# Patient Record
Sex: Female | Born: 1935 | Race: Black or African American | Hispanic: No | Marital: Married | Smoking: Never smoker
Health system: Southern US, Community
[De-identification: ages and names within clinical notes are randomized; demographics above are authoritative.]

## PROBLEM LIST (undated history)

## (undated) DIAGNOSIS — I509 Heart failure, unspecified: Secondary | ICD-10-CM

## (undated) DIAGNOSIS — I4891 Unspecified atrial fibrillation: Secondary | ICD-10-CM

## (undated) DIAGNOSIS — IMO0001 Reserved for inherently not codable concepts without codable children: Secondary | ICD-10-CM

## (undated) DIAGNOSIS — I10 Essential (primary) hypertension: Secondary | ICD-10-CM

## (undated) DIAGNOSIS — E119 Type 2 diabetes mellitus without complications: Secondary | ICD-10-CM

## (undated) DIAGNOSIS — E785 Hyperlipidemia, unspecified: Secondary | ICD-10-CM

## (undated) HISTORY — PX: CORONARY ARTERY BYPASS GRAFT: SHX141

## (undated) HISTORY — PX: ABDOMINAL HYSTERECTOMY: SHX81

---

## 2004-01-24 ENCOUNTER — Other Ambulatory Visit: Payer: Self-pay

## 2007-01-24 ENCOUNTER — Inpatient Hospital Stay: Payer: Self-pay | Admitting: Internal Medicine

## 2007-01-24 ENCOUNTER — Other Ambulatory Visit: Payer: Self-pay

## 2007-01-30 ENCOUNTER — Other Ambulatory Visit: Payer: Self-pay

## 2007-02-02 ENCOUNTER — Other Ambulatory Visit: Payer: Self-pay

## 2007-02-10 ENCOUNTER — Encounter: Payer: Self-pay | Admitting: Internal Medicine

## 2007-02-11 ENCOUNTER — Encounter: Payer: Self-pay | Admitting: Internal Medicine

## 2007-02-28 ENCOUNTER — Ambulatory Visit: Payer: Self-pay | Admitting: Internal Medicine

## 2007-04-08 ENCOUNTER — Other Ambulatory Visit: Payer: Self-pay

## 2007-04-08 ENCOUNTER — Inpatient Hospital Stay: Payer: Self-pay | Admitting: Internal Medicine

## 2007-05-13 ENCOUNTER — Other Ambulatory Visit: Payer: Self-pay

## 2007-05-13 ENCOUNTER — Inpatient Hospital Stay: Payer: Self-pay | Admitting: Internal Medicine

## 2008-09-28 IMAGING — CT CT CHEST W/ CM
1 series · 15 of 33 positions shown, 19 images · non-contrast
Comparison: none

REASON FOR EXAM: hypoxemia
COMMENTS:

[Series 7: soft tissue · axial · 0.74mm/px · z∈[+374,+632]mm · 15 of 102 slices shown, 19 images]
[im 8/102  mediastinal]
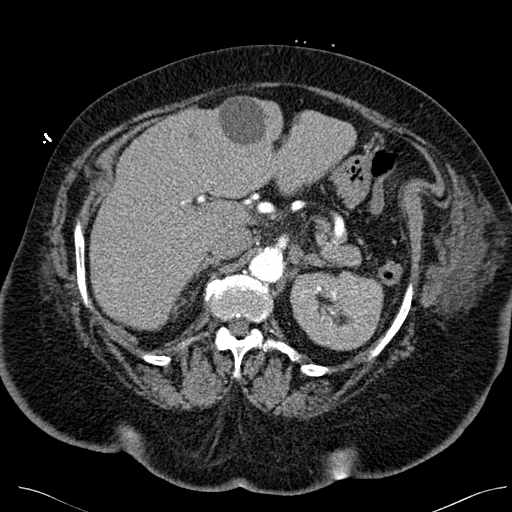
[im 8/102  lung]
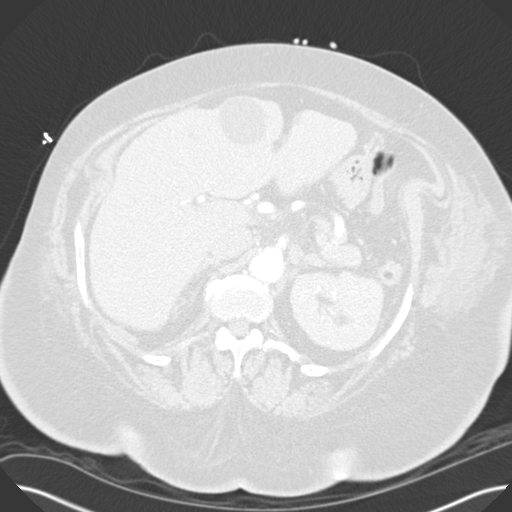
[im 15/102  lung]
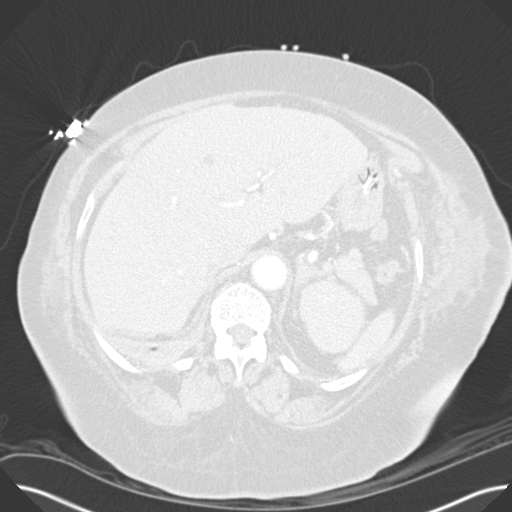
[im 21/102  lung]
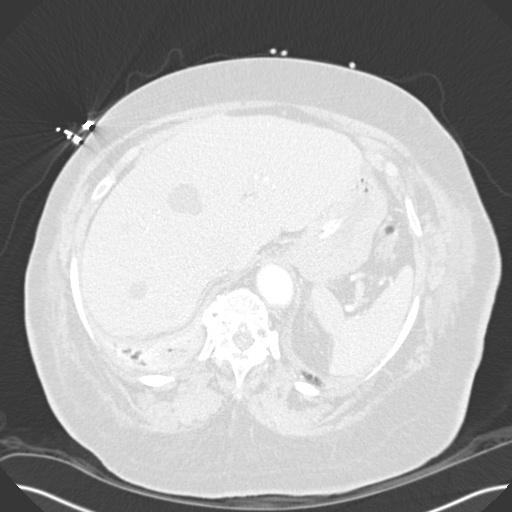
[im 27/102  lung]
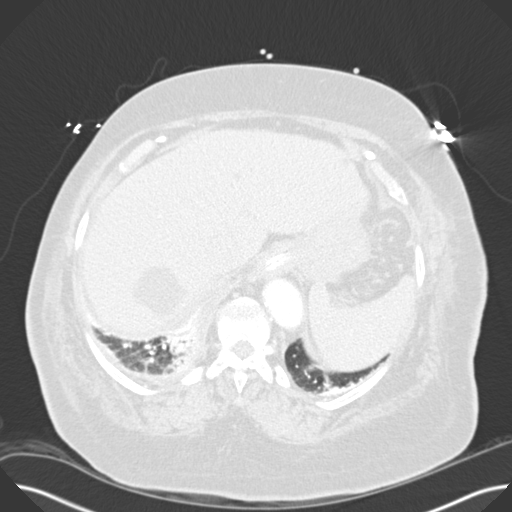
[im 34/102  mediastinal]
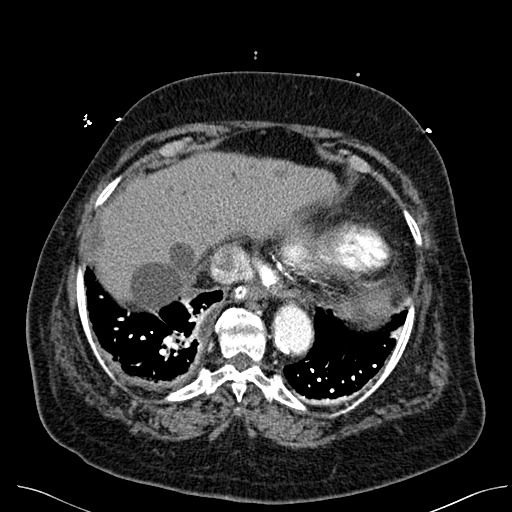
[im 34/102  lung]
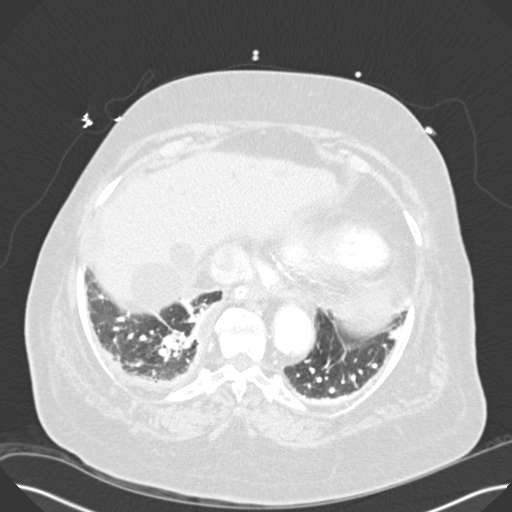
[im 41/102  lung]
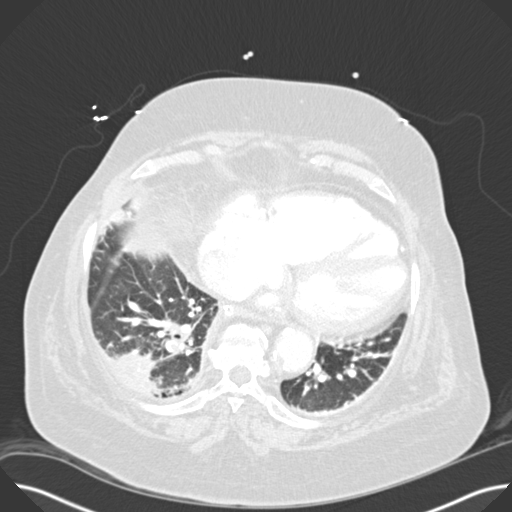
[im 45/102  lung]
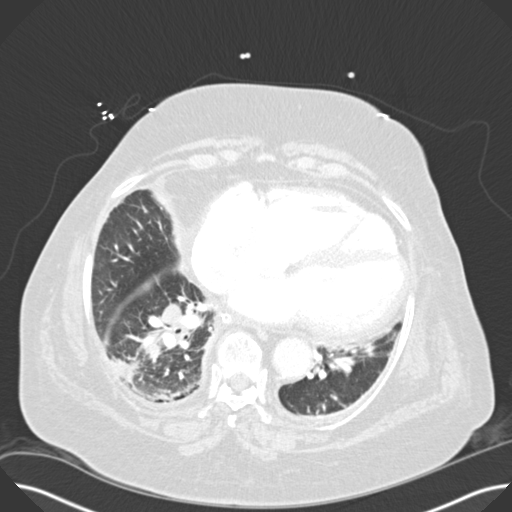
[im 53/102  lung]
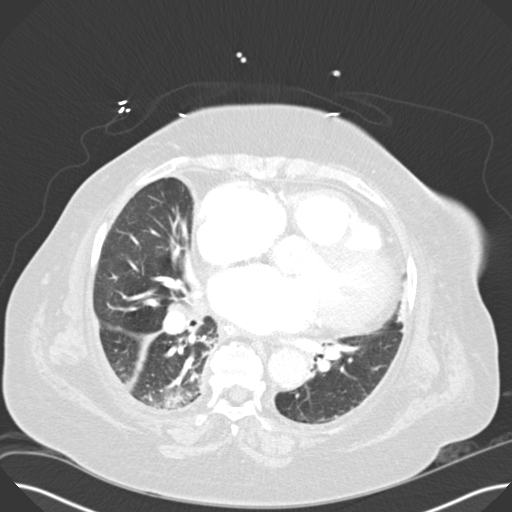
[im 57/102  mediastinal]
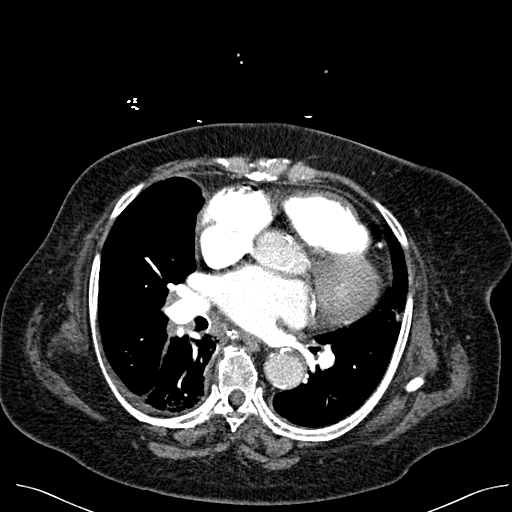
[im 57/102  lung]
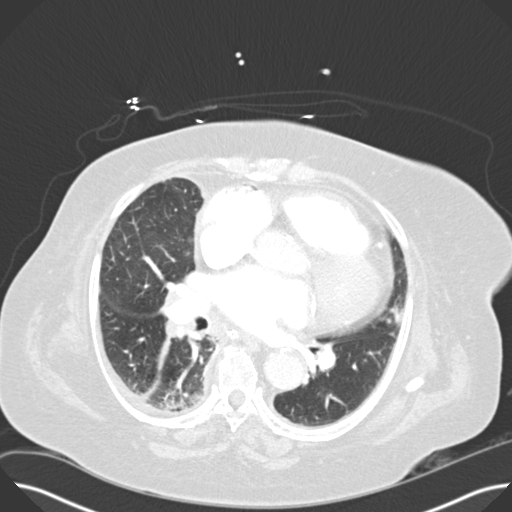
[im 61/102  lung]
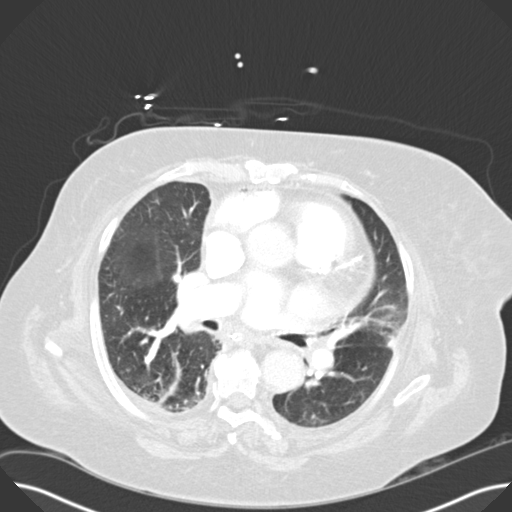
[im 68/102  lung]
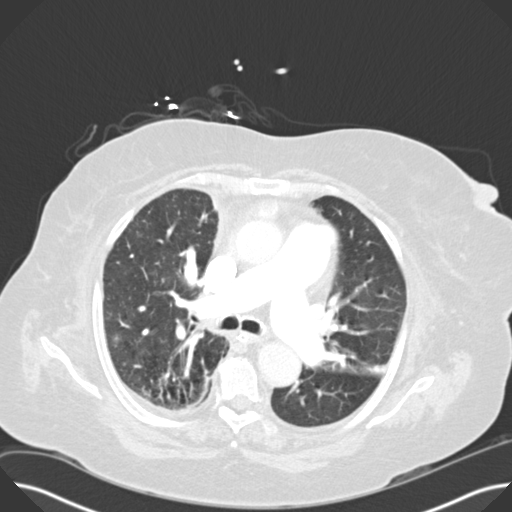
[im 75/102  lung]
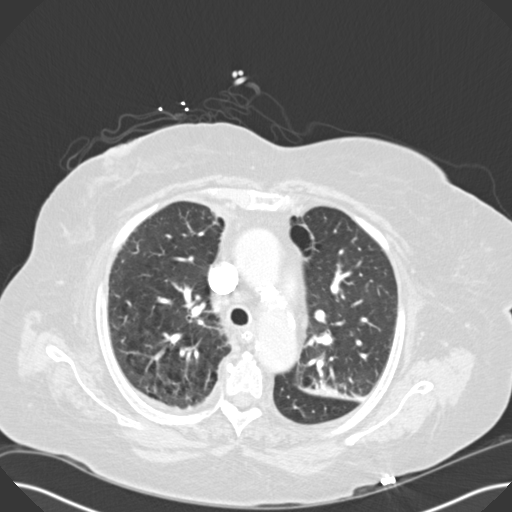
[im 81/102  mediastinal]
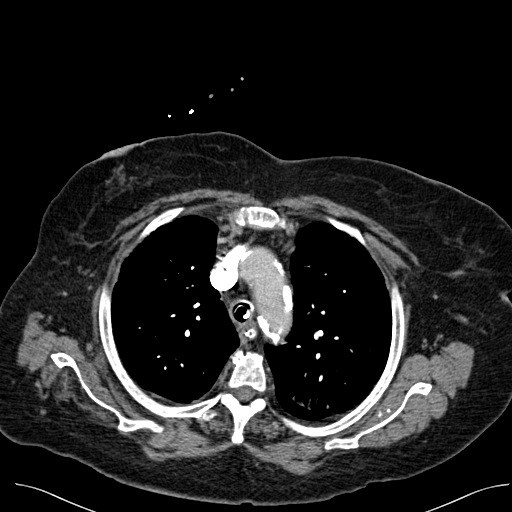
[im 81/102  lung]
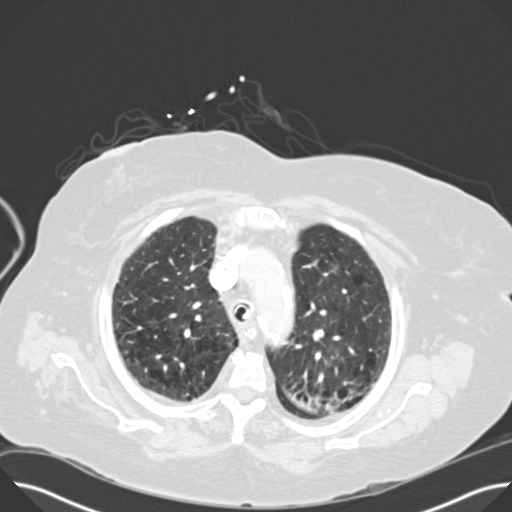
[im 87/102  lung]
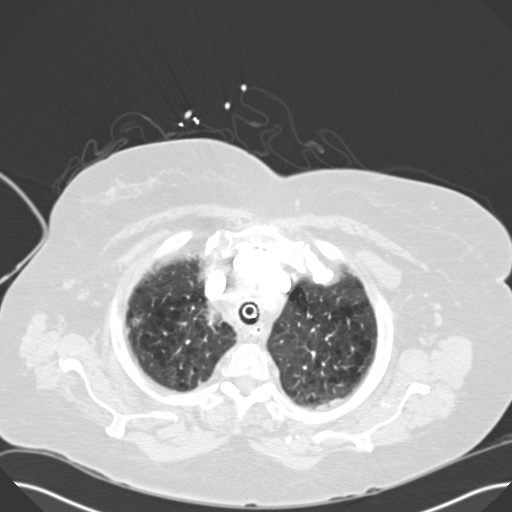
[im 94/102  lung]
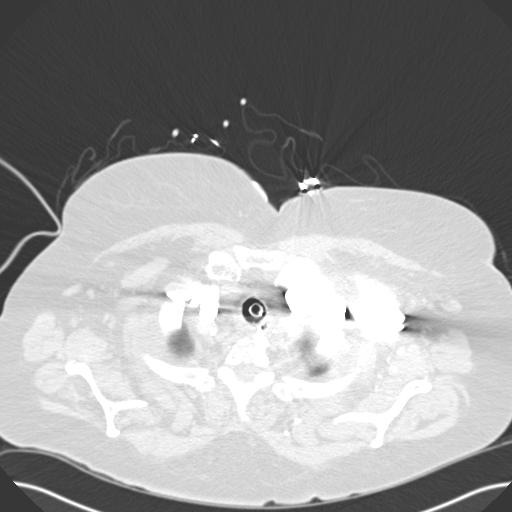

[15 of 33 positions shown; findings below may reference images not displayed]

PROCEDURE:     CT  - CT CHEST (FOR PE) W  - January 30, 2007  [DATE]

RESULT:     Helical 3 mm sections were obtained from the thoracic inlet
through the lung bases status post intravenous administration of 100 ml of
Isovue 370.

Evaluation of the mediastinum and hilar regions and structures demonstrates
multichamber cardiac enlargement.  There is evidence of adenopathy within
the RIGHT lower hilar region.  Evaluation of the pulmonary arterial system
demonstrates filling defects within segmental and subsegmental pulmonary
arteries in the RIGHT lower lobe region.  No further filling defects are
appreciated. Evaluation of the lung parenchyma demonstrates patchy areas of
nodular density within the RIGHT lung particularly within the peripheral
base.  This may be represent a region of lung infarction though etiology
such as atelectasis, infiltrate or possibly even a mass cannot be excluded.
There is also thickening of the interstitial markings throughout both lungs.
The visualized upper abdominal viscera demonstrate multiple low attenuating
areas scattered throughout the liver and statistically likely representing
multiple cysts. The remaining visualized upper abdominal viscera otherwise
are grossly unremarkable.
IMPRESSION: CV:1)Pulmonary embolic disease within segmental and subsegmental pulmonary
arteries in the RIGHT lower lobe.

2)Interstitial changes within the RIGHT and LEFT lungs.

3)Atelectasis versus infiltrate versus a more ominous etiology along the
peripheral base of the RIGHT lower lobe. These findings were relayed to the
patient's floor at the time of the initial interpretation.

## 2008-09-30 IMAGING — US US PELV - US TRANSVAGINAL
1 series · 17 of 17 positions shown · non-contrast
Comparison: none

REASON FOR EXAM: Vaginal bleed in patient with pulmonary embolism for PE
COMMENTS:

[Series 1: us pelv - us transvaginal · 17 of 17 slices shown]
[im 1/17]
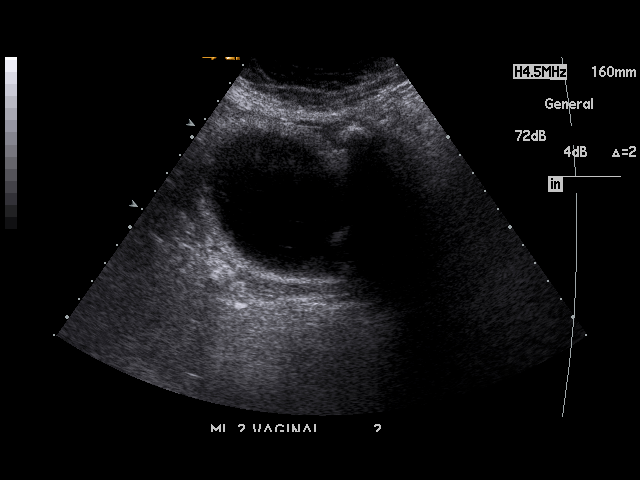
[im 2/17]
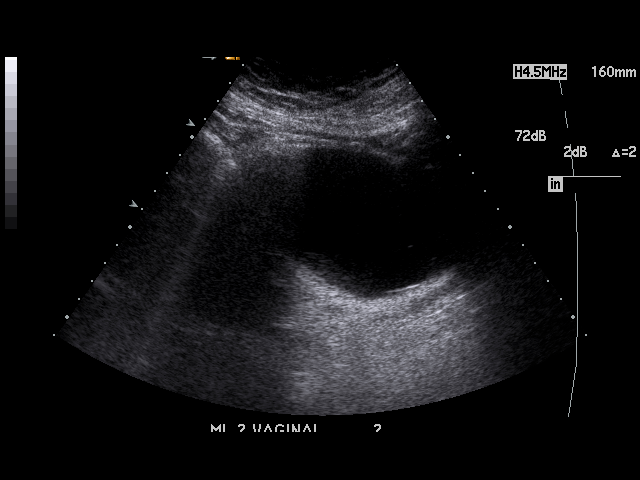
[im 3/17]
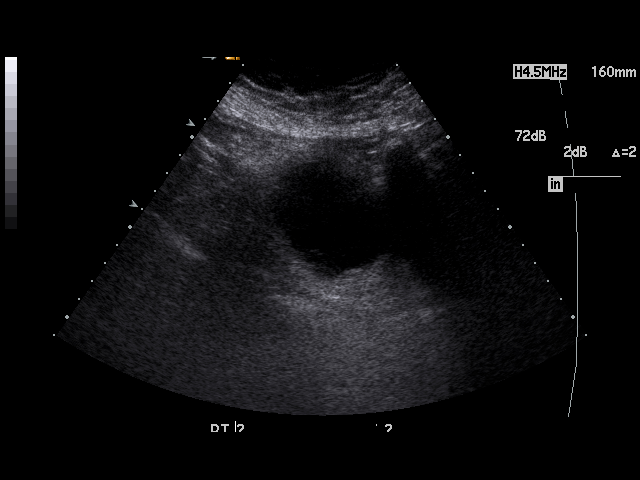
[im 4/17]
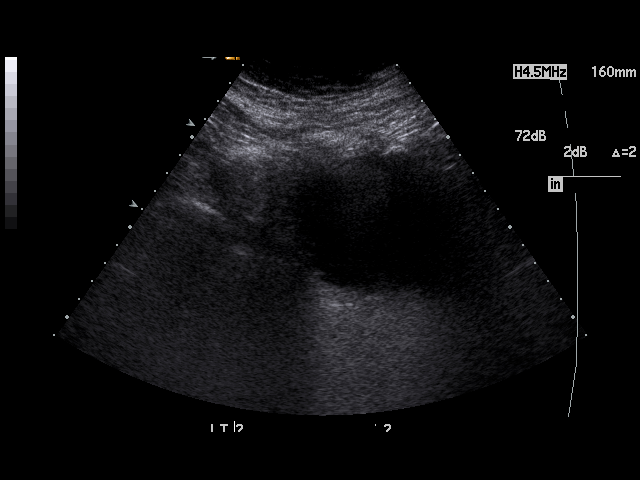
[im 5/17]
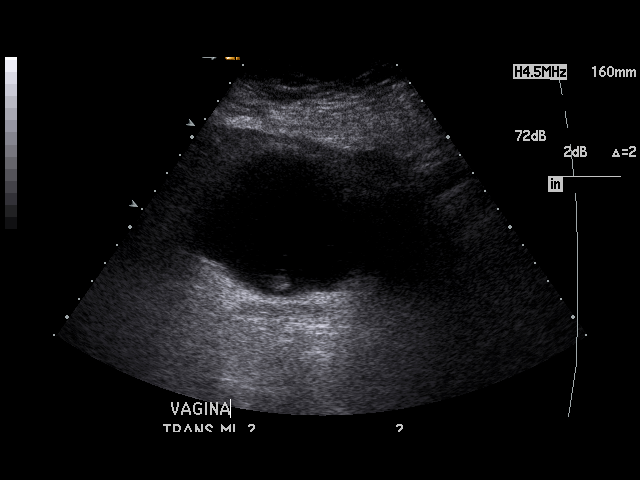
[im 6/17]
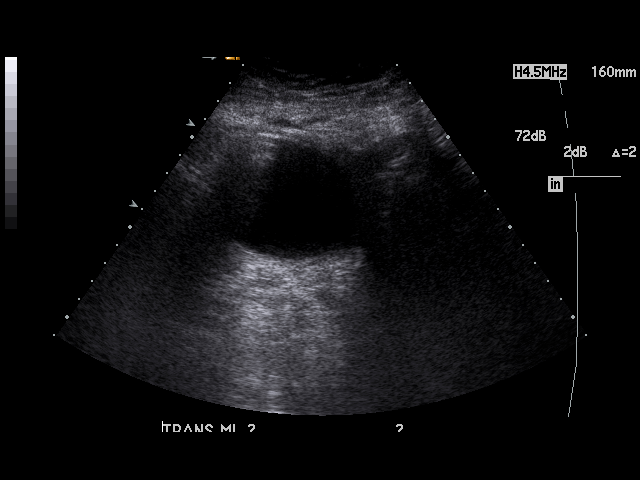
[im 7/17]
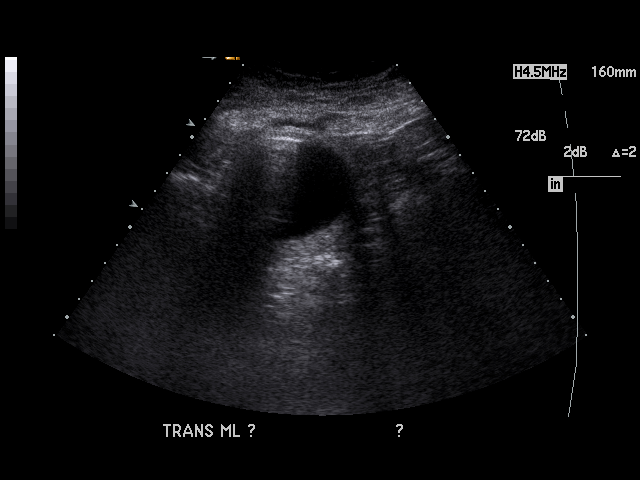
[im 8/17]
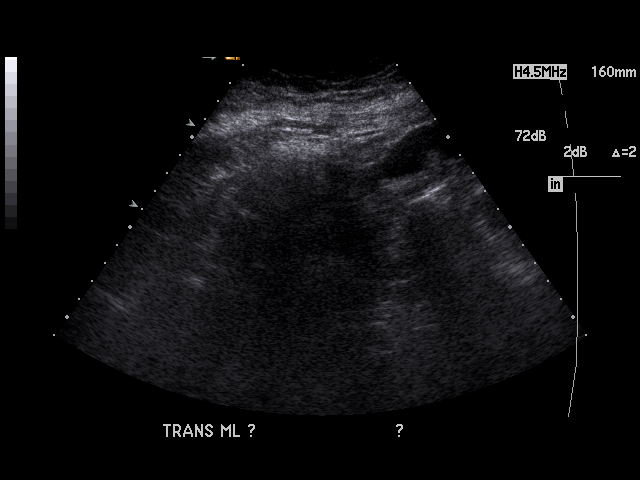
[im 9/17]
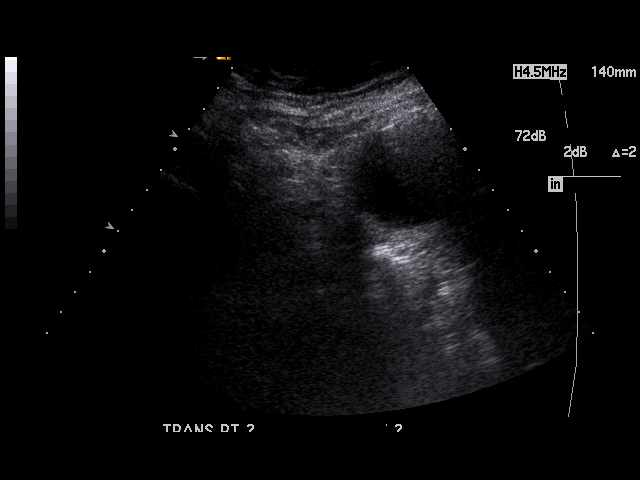
[im 10/17]
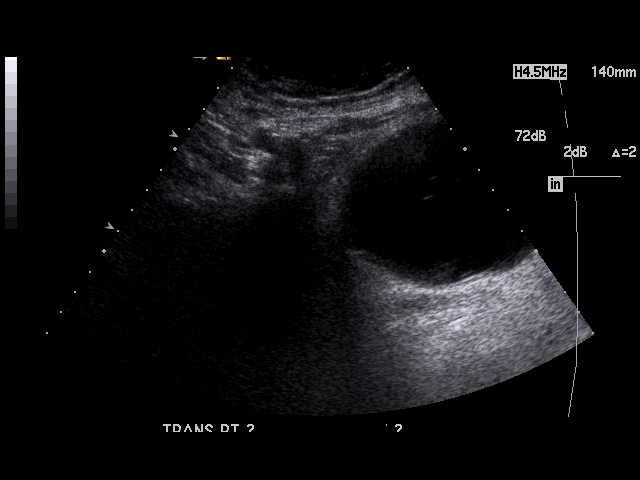
[im 11/17]
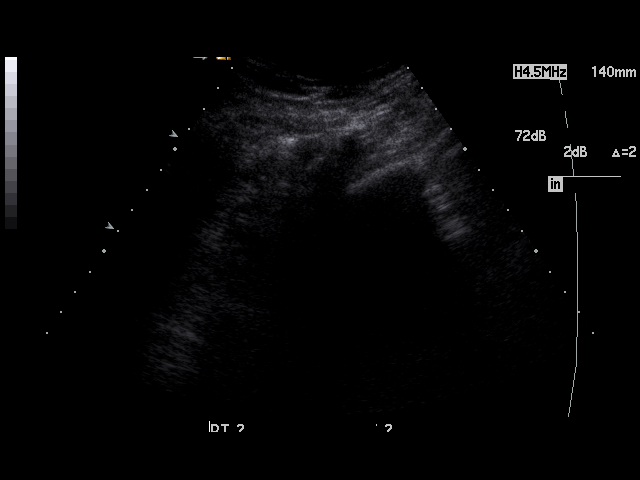
[im 12/17]
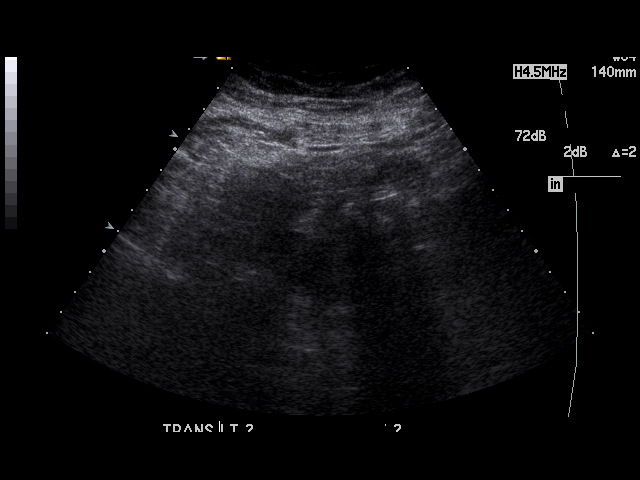
[im 13/17]
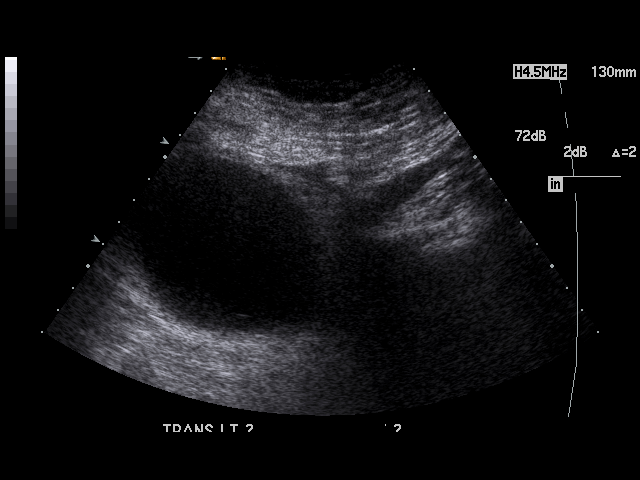
[im 14/17]
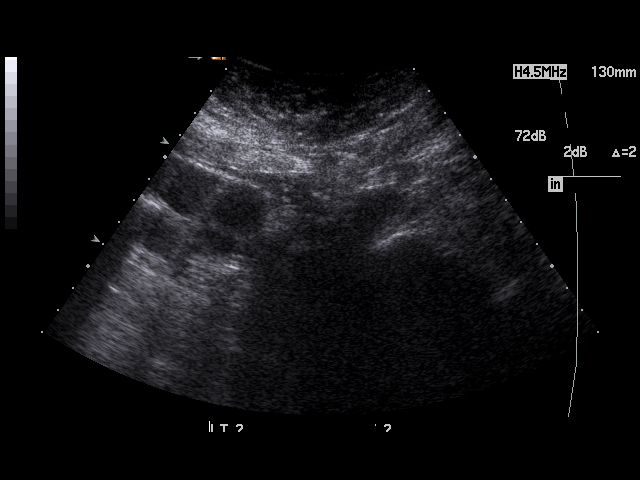
[im 15/17]
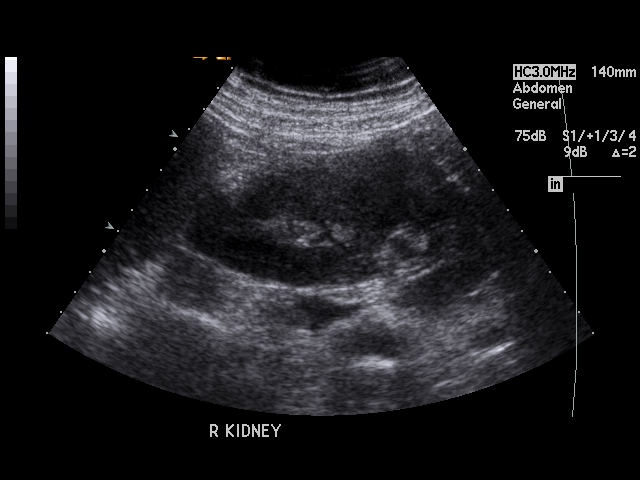
[im 16/17]
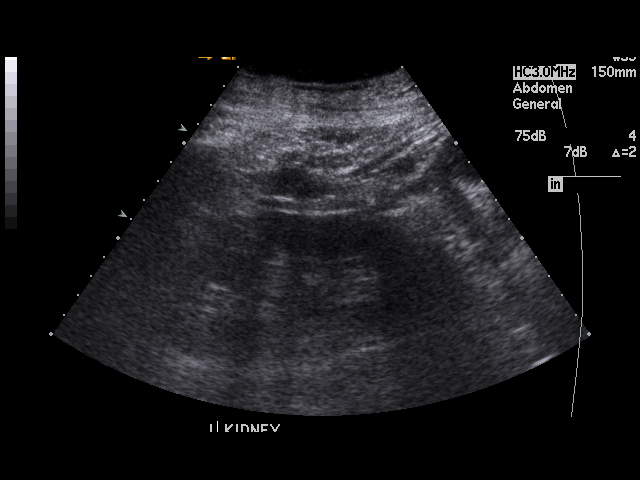
[im 17/17]
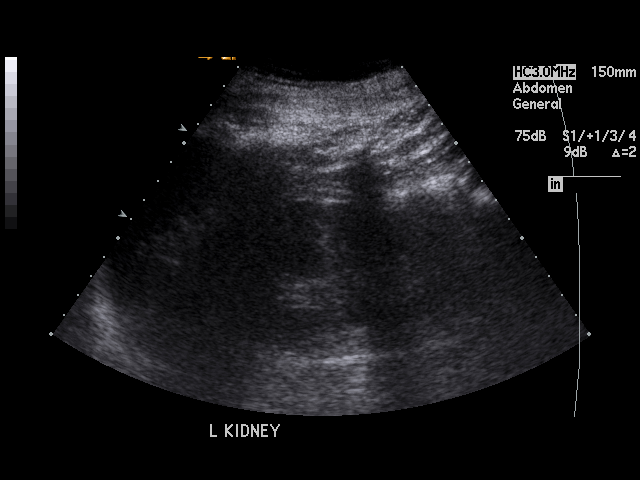

[17 of 17 positions shown; findings below may reference images not displayed]

PROCEDURE:     US  - US PELVIS MASS EXAM  - [DATE] [DATE] [DATE] [DATE]

RESULT:     Transabdominal images were obtained. The patient is status post
hysterectomy. Evaluation of the pelvis demonstrates no evidence of pelvic
masses, free fluid, or drainable loculated fluid collections.  The ovaries
are not identified.  The urinary bladder is partially distended.  Limited
evaluation of the RIGHT and LEFT kidneys demonstrates no gross
abnormalities.
IMPRESSION: Unremarkable pelvic ultrasound in a patient with the
history of a prior hysterectomy. There is no evidence of drainable loculated
fluid collections or masses.

## 2008-10-02 IMAGING — US US CAROTID DUPLEX BILAT
1 series · 17 of 24 positions shown · non-contrast
Comparison: none

REASON FOR EXAM: subacute infarct
COMMENTS:

[Series 1: us carotid duplex bilat · 17 of 53 slices shown]
[im 1/53]
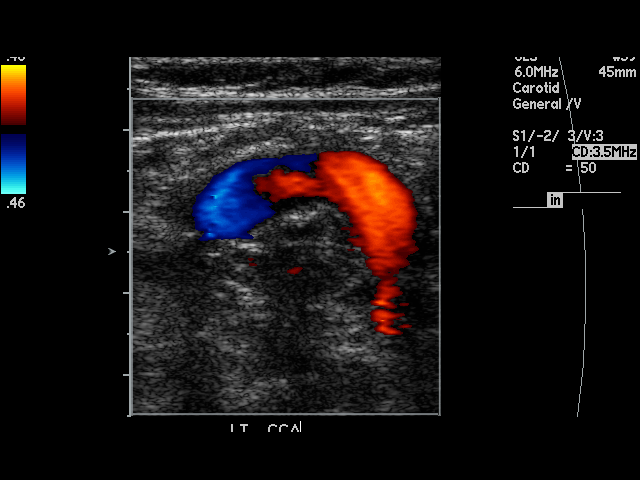
[im 5/53]
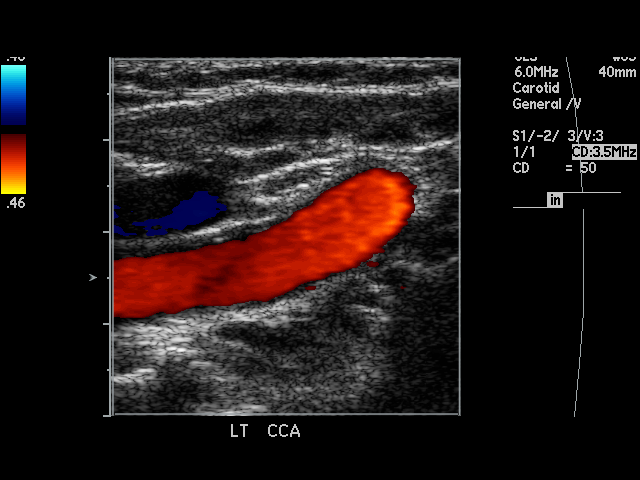
[im 7/53]
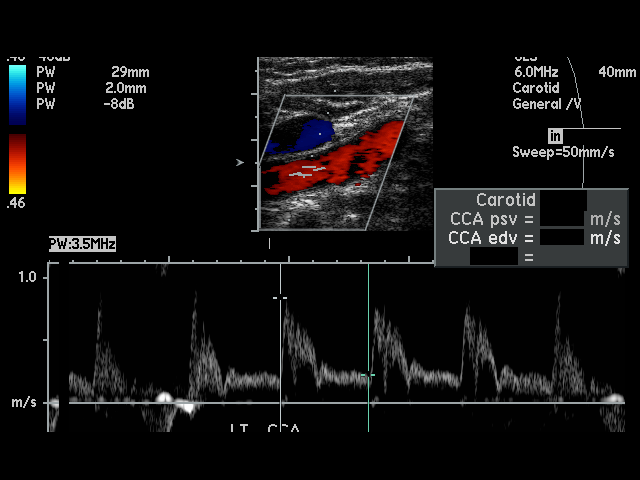
[im 10/53]
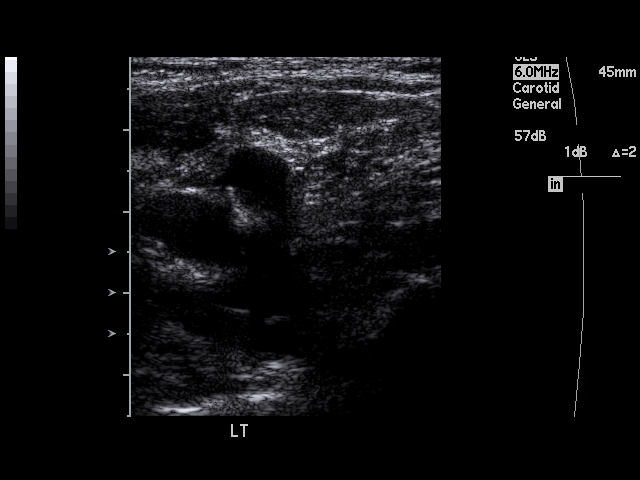
[im 14/53]
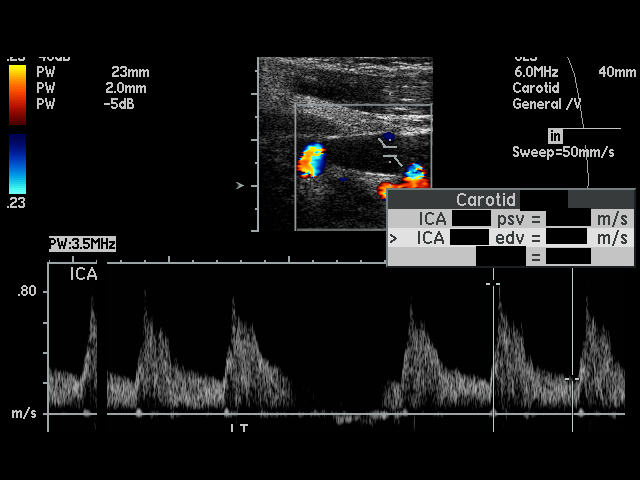
[im 16/53]
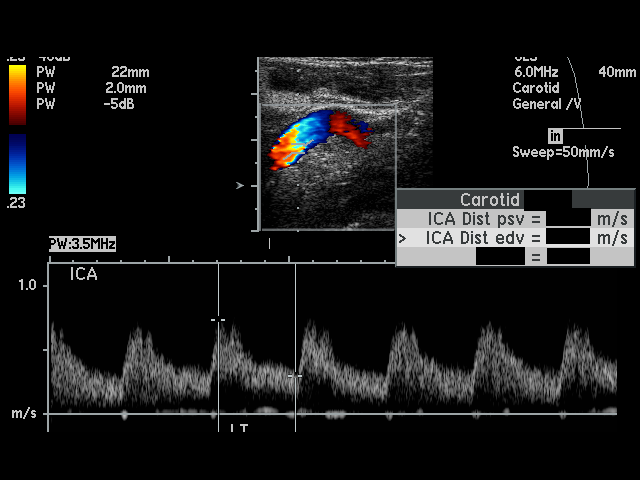
[im 21/53]
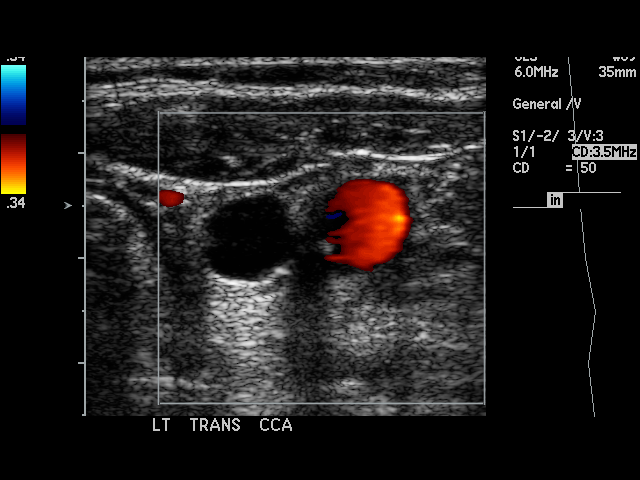
[im 23/53]
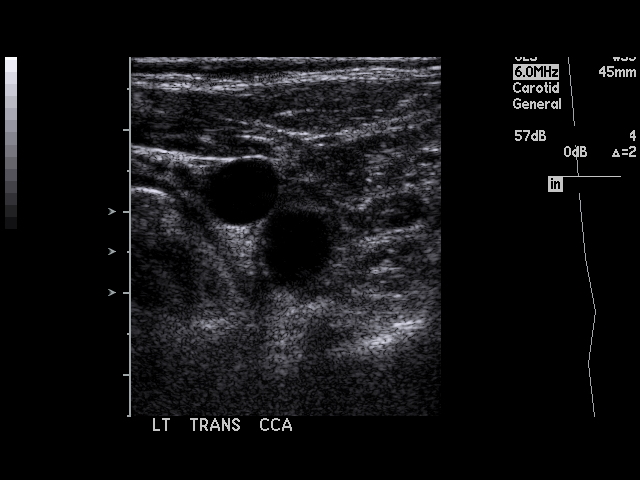
[im 28/53]
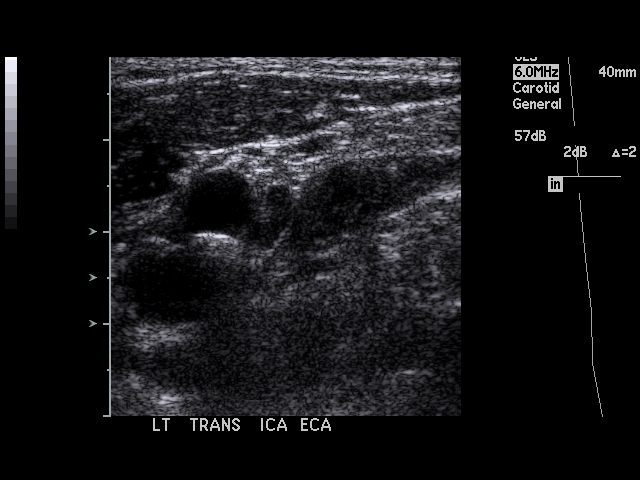
[im 30/53]
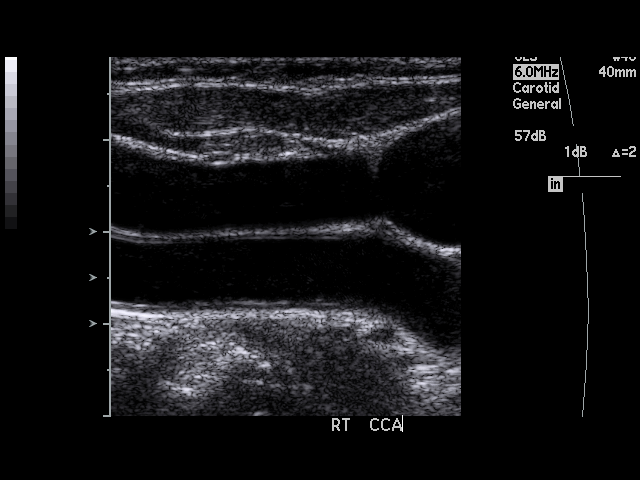
[im 32/53]
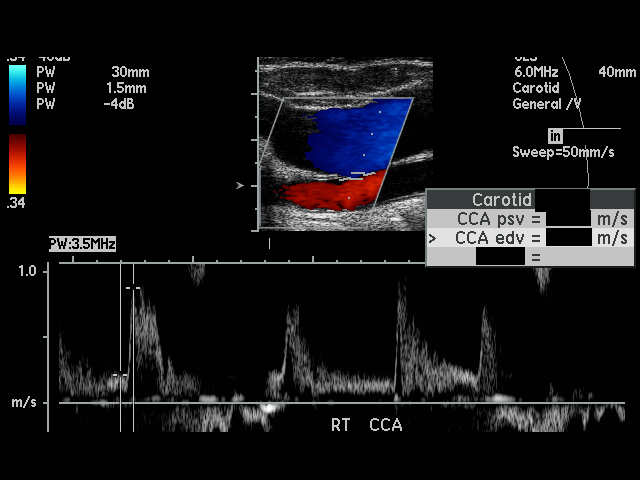
[im 37/53]
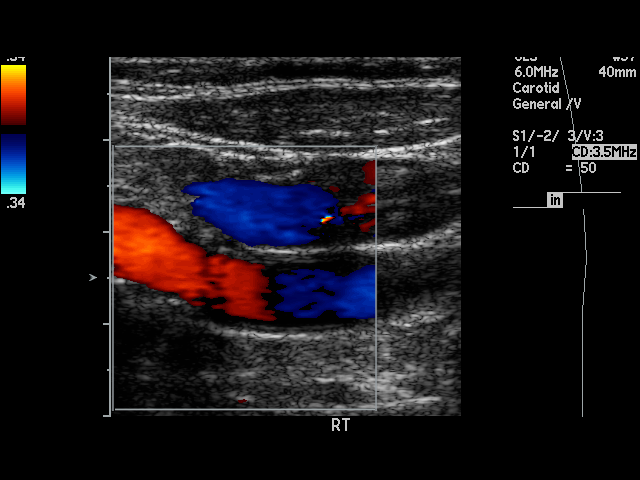
[im 39/53]
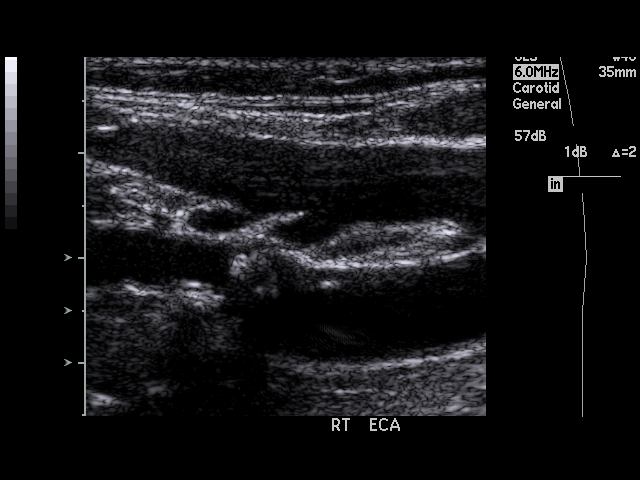
[im 43/53]
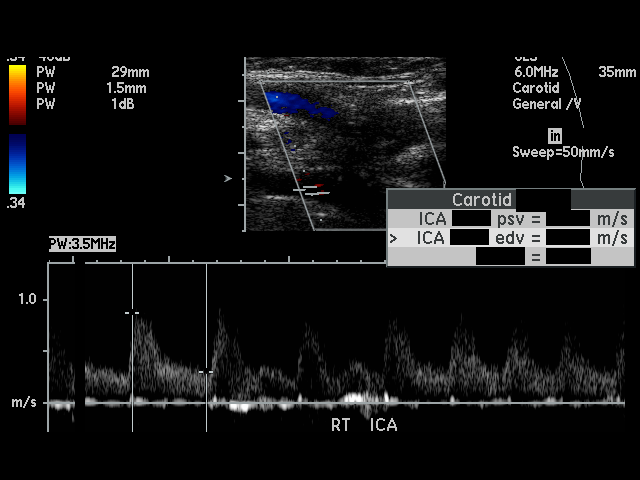
[im 46/53]
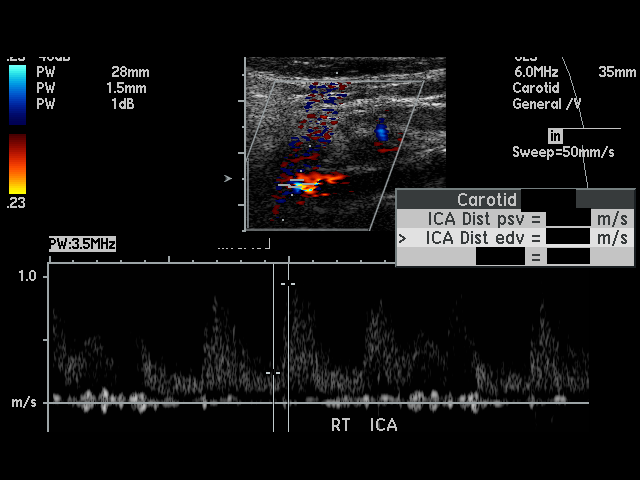
[im 48/53]
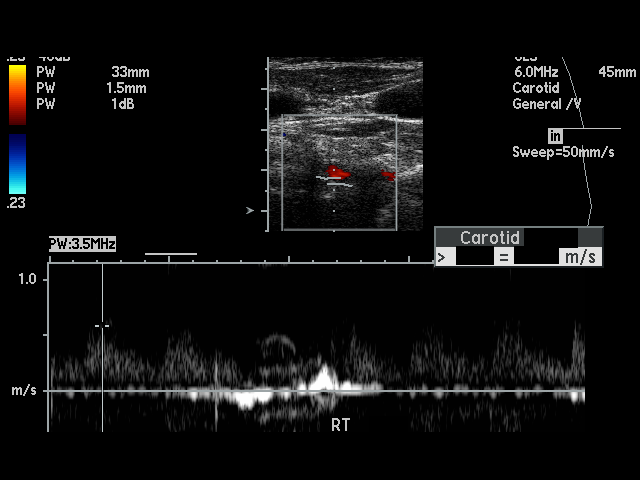
[im 53/53]
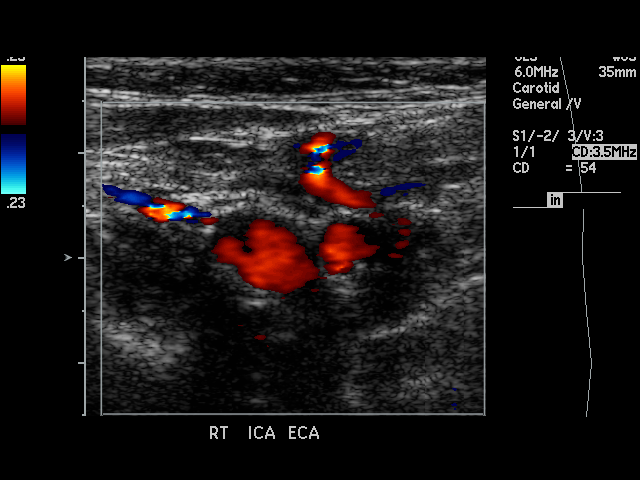

[17 of 24 positions shown; findings below may reference images not displayed]

PROCEDURE:     US  - US CAROTID DOPPLER BILATERAL  - February 03, 2007 [DATE]

RESULT:     There is slight calcific plaque formation about the carotids
bifurcations bilaterally. On the RIGHT, the peak RIGHT common carotid artery
flow velocity measures .955 meters per second and the peak RIGHT internal
carotid artery flow velocity measures 1.320 meters per second. The IC/CC
ratio is 1.382. On the LEFT, the peak LEFT common carotid artery flow
velocity measures .903 meters per second and the peak LEFT internal carotid
artery flow velocity measures .941 meters per second. The IC/CC ratio is
1.042.  These values bilaterally are in the normal range and compatible with
the absence of hemodynamically significant stenosis.

Antegrade flow is noted in both vertebrals.
IMPRESSION: 1. There is noted slight calcific plaque formation about the carotid
bifurcations bilaterally.
2. No hemodynamically significant stenosis is seen on either side.
3. Antegrade flow is noted in both vertebrals.

## 2015-09-01 ENCOUNTER — Emergency Department: Payer: Medicare Other

## 2015-09-01 ENCOUNTER — Inpatient Hospital Stay: Admit: 2015-09-01 | Payer: Medicare Other

## 2015-09-01 ENCOUNTER — Inpatient Hospital Stay
Admission: EM | Admit: 2015-09-01 | Discharge: 2015-09-05 | DRG: 291 | Disposition: A | Discharge status: Home / Self Care | Payer: Medicare Other | Attending: Internal Medicine | Admitting: Internal Medicine

## 2015-09-01 DIAGNOSIS — R0902 Hypoxemia: Secondary | ICD-10-CM

## 2015-09-01 DIAGNOSIS — J9601 Acute respiratory failure with hypoxia: Secondary | ICD-10-CM | POA: Diagnosis present

## 2015-09-01 DIAGNOSIS — I255 Ischemic cardiomyopathy: Secondary | ICD-10-CM | POA: Diagnosis present

## 2015-09-01 DIAGNOSIS — R0602 Shortness of breath: Secondary | ICD-10-CM

## 2015-09-01 DIAGNOSIS — I2510 Atherosclerotic heart disease of native coronary artery without angina pectoris: Secondary | ICD-10-CM | POA: Diagnosis present

## 2015-09-01 DIAGNOSIS — R778 Other specified abnormalities of plasma proteins: Secondary | ICD-10-CM

## 2015-09-01 DIAGNOSIS — Z90710 Acquired absence of both cervix and uterus: Secondary | ICD-10-CM

## 2015-09-01 DIAGNOSIS — I5033 Acute on chronic diastolic (congestive) heart failure: Secondary | ICD-10-CM

## 2015-09-01 DIAGNOSIS — I340 Nonrheumatic mitral (valve) insufficiency: Secondary | ICD-10-CM | POA: Diagnosis present

## 2015-09-01 DIAGNOSIS — R7989 Other specified abnormal findings of blood chemistry: Secondary | ICD-10-CM

## 2015-09-01 DIAGNOSIS — I080 Rheumatic disorders of both mitral and aortic valves: Secondary | ICD-10-CM | POA: Diagnosis present

## 2015-09-01 DIAGNOSIS — Z951 Presence of aortocoronary bypass graft: Secondary | ICD-10-CM | POA: Diagnosis not present

## 2015-09-01 DIAGNOSIS — Z23 Encounter for immunization: Secondary | ICD-10-CM

## 2015-09-01 DIAGNOSIS — E1122 Type 2 diabetes mellitus with diabetic chronic kidney disease: Secondary | ICD-10-CM | POA: Diagnosis present

## 2015-09-01 DIAGNOSIS — N390 Urinary tract infection, site not specified: Secondary | ICD-10-CM | POA: Diagnosis present

## 2015-09-01 DIAGNOSIS — N183 Chronic kidney disease, stage 3 (moderate): Secondary | ICD-10-CM | POA: Diagnosis present

## 2015-09-01 DIAGNOSIS — Z79899 Other long term (current) drug therapy: Secondary | ICD-10-CM

## 2015-09-01 DIAGNOSIS — I130 Hypertensive heart and chronic kidney disease with heart failure and stage 1 through stage 4 chronic kidney disease, or unspecified chronic kidney disease: Secondary | ICD-10-CM | POA: Diagnosis present

## 2015-09-01 DIAGNOSIS — I5031 Acute diastolic (congestive) heart failure: Secondary | ICD-10-CM

## 2015-09-01 DIAGNOSIS — E785 Hyperlipidemia, unspecified: Secondary | ICD-10-CM | POA: Diagnosis present

## 2015-09-01 DIAGNOSIS — I272 Other secondary pulmonary hypertension: Secondary | ICD-10-CM | POA: Diagnosis present

## 2015-09-01 DIAGNOSIS — I248 Other forms of acute ischemic heart disease: Secondary | ICD-10-CM | POA: Diagnosis present

## 2015-09-01 DIAGNOSIS — I350 Nonrheumatic aortic (valve) stenosis: Secondary | ICD-10-CM | POA: Diagnosis present

## 2015-09-01 DIAGNOSIS — R0989 Other specified symptoms and signs involving the circulatory and respiratory systems: Secondary | ICD-10-CM | POA: Diagnosis present

## 2015-09-01 DIAGNOSIS — R600 Localized edema: Secondary | ICD-10-CM

## 2015-09-01 DIAGNOSIS — I471 Supraventricular tachycardia: Secondary | ICD-10-CM | POA: Diagnosis present

## 2015-09-01 DIAGNOSIS — I509 Heart failure, unspecified: Secondary | ICD-10-CM

## 2015-09-01 HISTORY — DX: Heart failure, unspecified: I50.9

## 2015-09-01 HISTORY — DX: Type 2 diabetes mellitus without complications: E11.9

## 2015-09-01 HISTORY — DX: Reserved for inherently not codable concepts without codable children: IMO0001

## 2015-09-01 HISTORY — DX: Essential (primary) hypertension: I10

## 2015-09-01 HISTORY — DX: Hyperlipidemia, unspecified: E78.5

## 2015-09-01 LAB — BASIC METABOLIC PANEL
ANION GAP: 6 (ref 5–15)
BUN: 25 mg/dL — ABNORMAL HIGH (ref 6–20)
CALCIUM: 8.6 mg/dL — AB (ref 8.9–10.3)
CHLORIDE: 108 mmol/L (ref 101–111)
CO2: 29 mmol/L (ref 22–32)
Creatinine, Ser: 0.99 mg/dL (ref 0.44–1.00)
GFR calc non Af Amer: 49 mL/min — ABNORMAL LOW (ref >60)
GFR, EST AFRICAN AMERICAN: 57 mL/min — AB (ref >60)
Glucose, Bld: 158 mg/dL — ABNORMAL HIGH (ref 65–99)
Potassium: 4 mmol/L (ref 3.5–5.1)
SODIUM: 143 mmol/L (ref 135–145)

## 2015-09-01 LAB — CBC
HCT: 41.5 % (ref 35.0–47.0)
HEMOGLOBIN: 12.9 g/dL (ref 12.0–16.0)
MCH: 28.4 pg (ref 26.0–34.0)
MCHC: 31.1 g/dL — ABNORMAL LOW (ref 32.0–36.0)
MCV: 91.4 fL (ref 80.0–100.0)
Platelets: 209 10*3/uL (ref 150–440)
RBC: 4.54 MIL/uL (ref 3.80–5.20)
RDW: 19.8 % — ABNORMAL HIGH (ref 11.5–14.5)
WBC: 4.4 10*3/uL (ref 3.6–11.0)

## 2015-09-01 LAB — URINALYSIS COMPLETE WITH MICROSCOPIC (ARMC ONLY)
BILIRUBIN URINE: NEGATIVE
Bacteria, UA: NONE SEEN
GLUCOSE, UA: NEGATIVE mg/dL
Ketones, ur: NEGATIVE mg/dL
Nitrite: NEGATIVE
Protein, ur: NEGATIVE mg/dL
Specific Gravity, Urine: 1.004 — ABNORMAL LOW (ref 1.005–1.030)
pH: 5 (ref 5.0–8.0)

## 2015-09-01 LAB — TROPONIN I
TROPONIN I: 0.04 ng/mL — AB (ref <0.031)
TROPONIN I: 0.04 ng/mL — AB (ref <0.031)
TROPONIN I: 0.06 ng/mL — AB (ref <0.031)

## 2015-09-01 LAB — BRAIN NATRIURETIC PEPTIDE: B Natriuretic Peptide: 815 pg/mL — ABNORMAL HIGH (ref 0.0–100.0)

## 2015-09-01 MED ORDER — INFLUENZA VAC SPLIT QUAD 0.5 ML IM SUSY
0.5000 mL | PREFILLED_SYRINGE | INTRAMUSCULAR | Status: AC
  Administered 2015-09-02: 0.5 mL via INTRAMUSCULAR
  Filled 2015-09-01: qty 0.5

## 2015-09-01 MED ORDER — DILTIAZEM HCL ER COATED BEADS 240 MG PO CP24
240.0000 mg | ORAL_CAPSULE | Freq: Every day | ORAL | Status: DC
  Administered 2015-09-01 – 2015-09-05 (×5): 240 mg via ORAL
  Filled 2015-09-01 (×9): qty 1

## 2015-09-01 MED ORDER — NITROGLYCERIN 0.4 MG SL SUBL
0.4000 mg | SUBLINGUAL_TABLET | SUBLINGUAL | Status: DC | PRN
Start: 2015-09-01 — End: 2015-09-05
  Filled 2015-09-01: qty 1

## 2015-09-01 MED ORDER — POLYETHYLENE GLYCOL 3350 17 G PO PACK
17.0000 g | PACK | Freq: Every day | ORAL | Status: DC | PRN
  Administered 2015-09-03: 17 g via ORAL
  Filled 2015-09-01: qty 1

## 2015-09-01 MED ORDER — FUROSEMIDE 10 MG/ML IJ SOLN
20.0000 mg | Freq: Two times a day (BID) | INTRAMUSCULAR | Status: DC
  Administered 2015-09-02 – 2015-09-05 (×7): 20 mg via INTRAVENOUS
  Filled 2015-09-01 (×7): qty 2

## 2015-09-01 MED ORDER — CETYLPYRIDINIUM CHLORIDE 0.05 % MT LIQD
7.0000 mL | Freq: Two times a day (BID) | OROMUCOSAL | Status: DC
  Administered 2015-09-01 – 2015-09-04 (×7): 7 mL via OROMUCOSAL

## 2015-09-01 MED ORDER — ATORVASTATIN CALCIUM 20 MG PO TABS
20.0000 mg | ORAL_TABLET | Freq: Every day | ORAL | Status: DC
  Administered 2015-09-01 – 2015-09-05 (×5): 20 mg via ORAL
  Filled 2015-09-01 (×5): qty 1

## 2015-09-01 MED ORDER — SODIUM CHLORIDE 0.9 % IJ SOLN
3.0000 mL | Freq: Two times a day (BID) | INTRAMUSCULAR | Status: DC
  Administered 2015-09-01 – 2015-09-05 (×8): 3 mL via INTRAVENOUS

## 2015-09-01 MED ORDER — ACETAMINOPHEN 325 MG PO TABS
650.0000 mg | ORAL_TABLET | Freq: Four times a day (QID) | ORAL | Status: DC | PRN
  Administered 2015-09-03 (×2): 650 mg via ORAL
  Filled 2015-09-01 (×2): qty 2

## 2015-09-01 MED ORDER — FUROSEMIDE 10 MG/ML IJ SOLN
40.0000 mg | Freq: Once | INTRAMUSCULAR | Status: AC
  Administered 2015-09-01: 40 mg via INTRAVENOUS
  Filled 2015-09-01: qty 4

## 2015-09-01 MED ORDER — ASPIRIN EC 325 MG PO TBEC
325.0000 mg | DELAYED_RELEASE_TABLET | Freq: Once | ORAL | Status: AC
  Administered 2015-09-01: 325 mg via ORAL
  Filled 2015-09-01: qty 1

## 2015-09-01 MED ORDER — ACETAMINOPHEN 650 MG RE SUPP: 650.0000 mg | Freq: Four times a day (QID) | RECTAL | Status: DC | PRN

## 2015-09-01 MED ORDER — HEPARIN SODIUM (PORCINE) 5000 UNIT/ML IJ SOLN
5000.0000 [IU] | Freq: Three times a day (TID) | INTRAMUSCULAR | Status: DC
  Administered 2015-09-01 – 2015-09-02 (×4): 5000 [IU] via SUBCUTANEOUS
  Filled 2015-09-01 (×3): qty 1

## 2015-09-01 MED ORDER — DILTIAZEM HCL ER 240 MG PO CP24: 240.0000 mg | ORAL_CAPSULE | Freq: Every day | ORAL | Status: DC

## 2015-09-01 NOTE — Progress Notes (Signed)
Pastoral Care and prayer provided.  °

## 2015-09-01 NOTE — Progress Notes (Addendum)
After paging Dr. Clent Ridges, patient's heart rate has now come down to the 60's. Patient and son both say they cannot remember if cardizem before that they is something the patient takes or not. Found cardizem on home medication list and explained to patient she should take it to control her heart rate over night from going back up. Patient agreed.

## 2015-09-01 NOTE — H&P (Addendum)
[facility] [place] at [facility]   PATIENT NAME: Faith Brown    MR#:  [id number]  DATE OF BIRTH:  Jun 16, 1925  DATE OF ADMISSION:  09/01/2015  PRIMARY CARE PHYSICIAN: No primary care provider on file.   REQUESTING/REFERRING PHYSICIAN: Dr. Sharma Covert   CHIEF COMPLAINT:   Chief Complaint  Patient presents with  . Shortness of Breath    HISTORY OF PRESENT ILLNESS:  Faith Brown  is a 82 y.o. female with a known history of congestive heart failure, coronary artery disease status post CABG diabetes, hypertension presents today with 3-4 weeks of progressive lower extremity edema and shortness of breath. She has not seen her primary care doctor or had any medications for about 9 months. History is provided by her son who lives with her. He states that she has had to sit up while sleeping at night due to shortness of breath. She does have occasional chest pain which is not persistent. She has not had any nausea, vomiting, diarrhea. She has not had any confusion. She has been so fatigued that she is not left the house in greater than one week. On presentation to the emergency room she is hypoxic with oxygen saturation 80% on room air. Chest x-ray shows pulmonary vascular congestion. She is also tachycardic with EKG showing SVT with rates in the 150s.  PAST MEDICAL HISTORY:   Past Medical History  Diagnosis Date  . Hypertension   . CHF (congestive heart failure) (HCC)   . Hyperlipemia   . Diabetes mellitus without complication (HCC)   . Shortness of breath dyspnea     PAST SURGICAL HISTORY:   Past Surgical History  Procedure Laterality Date  . Abdominal hysterectomy    . Coronary artery bypass graft      SOCIAL HISTORY:   Social History  Substance Use Topics  . Smoking status: Never Smoker   . Smokeless tobacco: Not on file  . Alcohol Use: No    FAMILY HISTORY:   Family History  Problem Relation Age of Onset  . Family history unknown: Yes     DRUG ALLERGIES:  No Known Allergies  REVIEW OF SYSTEMS:   Review of Systems  Constitutional: Positive for malaise/fatigue. Negative for fever, chills and weight loss.  HENT: Negative for congestion and hearing loss.   Eyes: Negative for blurred vision and pain.  Respiratory: Positive for shortness of breath and wheezing. Negative for cough, hemoptysis, sputum production and stridor.   Cardiovascular: Positive for chest pain, palpitations, orthopnea, leg swelling and PND.  Gastrointestinal: Negative for nausea, vomiting, abdominal pain, diarrhea, constipation and blood in stool.  Genitourinary: Negative for dysuria and frequency.  Musculoskeletal: Negative for myalgias, back pain, joint pain and neck pain.  Skin: Negative for rash.  Neurological: Positive for weakness. Negative for focal weakness, loss of consciousness and headaches.  Endo/Heme/Allergies: Does not bruise/bleed easily.  Psychiatric/Behavioral: Negative for depression and hallucinations. The patient is not nervous/anxious.     MEDICATIONS AT HOME:   Prior to Admission medications   Medication Sig Start Date End Date Taking? Authorizing Provider  atorvastatin (LIPITOR) 20 MG tablet Take 20 mg by mouth daily.   Yes Historical Provider, MD  diltiazem (DILACOR XR) 240 MG 24 hr capsule Take 240 mg by mouth daily.   Yes Historical Provider, MD  torsemide (DEMADEX) 20 MG tablet Take 20 mg by mouth daily.   Yes Historical Provider, MD      VITAL SIGNS:  Blood pressure 171/119, pulse 69, temperature  98 F (36.7 C), temperature source Oral, resp. rate 22, height  (1.676 m), weight 95.255 kg (210 lb), SpO2 98 %.  PHYSICAL EXAMINATION:  GENERAL:  81 y.o.-year-old patient lying in the bed, short of breath, seems uncomfortable EYES: Pupils equal, round, reactive to light and accommodation. No scleral icterus. Extraocular muscles intact.  HEENT: Head atraumatic, normocephalic. Oropharynx and nasopharynx clear. Mucous  membranes are dry NECK:  Supple, no jugular venous distention. No thyroid enlargement, no tenderness.  LUNGS: Short shallow respirations, bibasilar crackles left greater than right, no wheezes rales or rhonchi. No use of accessory muscles  CARDIOVASCULAR: S1, S2 normal. No murmurs, rubs, or gallops. Tachycardic, regular ABDOMEN: Soft, nontender, nondistended. Bowel sounds present. No organomegaly or mass. No guarding no rebound EXTREMITIES: 2+ pedal edema to the knees with generalized trace edema over the upper extremities as well, no cyanosis, or clubbing.  NEUROLOGIC: Cranial nerves II through XII are intact. Muscle strength 5/5 in all extremities. Sensation intact. Gait not checked.  PSYCHIATRIC: The patient is alert and oriented x 3. Anxious SKIN: No obvious rash, lesion, or ulcer.   LABORATORY PANEL:   CBC  Recent Labs Lab 09/01/15 1144  WBC 4.4  HGB 12.9  HCT 41.5  PLT 209   ------------------------------------------------------------------------------------------------------------------  Chemistries   Recent Labs Lab 09/01/15 1144  NA 143  K 4.0  CL 108  CO2 29  GLUCOSE 158*  BUN 25*  CREATININE 0.99  CALCIUM 8.6*   ------------------------------------------------------------------------------------------------------------------  Cardiac Enzymes  Recent Labs Lab 09/01/15 1144  TROPONINI 0.04*   ------------------------------------------------------------------------------------------------------------------  RADIOLOGY:  Dg Chest 2 View  09/01/2015  CLINICAL DATA:  One month history of shortness of breath EXAM: CHEST  2 VIEW COMPARISON:  April 08, 2007 FINDINGS: There is scarring in the left base region. There is a calcified granuloma in the right lower lobe. There is no demonstrable edema or consolidation. There is cardiomegaly with mild pulmonary venous hypertension. There is degenerative change in the thoracic spine and both shoulders. No apparent  adenopathy. IMPRESSION: Evidence of pulmonary vascular congestion. No frank edema or consolidation. Scarring left base. Calcified granuloma on right. Electronically Signed   By: Bretta Bang III M.D.   On: 09/01/2015 12:39    EKG:   Orders placed or performed during the hospital encounter of 09/01/15  . ED EKG within 10 minutes  . ED EKG within 10 minutes  . ED EKG  . ED EKG    IMPRESSION AND PLAN:   1. Acute congestive heart failure exacerbation: Last 2-D echocardiogram from 2000 shows preserved ejection fraction. Will repeat echo, start low sodium diet, daily weights, I's and O's. She has received 40 mg of IV Lasix in the emergency room, will continue with 40 twice a day. She has not taken any home medications for at least 9 months but formerly was on torsemide.  2. Sinus tachycardia: She is having episodes of narrow complex tachycardia with rates in the 150s to 170s possibly SVT. These seem to be spontaneously converting back to sinus tachycardia with a rate of 100. She was formerly on diltiazem, will resume at this time. Echo pending.  3. Acute respiratory failure with hypoxia: Due to CHF exacerbation. Continue supplemental oxygenation as needed. Wean as tolerated.  4. Elevated troponin: 0.04. This is likely due to strain in the setting of CHF exacerbation. Continue to cycle cardiac enzymes and monitor on telemetry  5. Hyperlipidemia: Continue statin. Check lipids in the morning as she has not been taking medication recently  6. Chronic kidney disease stage III: Do not have a baseline creatinine. This is likely stable. Continue to monitor.  7. Prophylaxis heparin for DVT prophylaxis  All the records are reviewed and case discussed with ED provider. Management plans discussed with the patient, and her son who lives with her and they are in agreement.  CODE STATUS: Full  TOTAL TIME TAKING CARE OF THIS PATIENT: 55 minutes.  Greater than 50% of time spent in coordination of  care and counseling.  Elby Showers M.D on 09/01/2015 at 2:09 PM  Between 7am to 6pm - Pager - [phone]  After 6pm go to [email or web address] - password [email or web address] [facility]  [facility]  Office  [phone]  CC: Primary care physician; No primary care provider on file.

## 2015-09-01 NOTE — ED Provider Notes (Signed)
[facility] Emergency Department Provider Note  ____________________________________________  Time seen: Approximately 12:13 PM  I have reviewed the triage vital signs and the nursing notes.   HISTORY  Chief Complaint Shortness of Breath    HPI Faith Brown is a 82 y.o. female with a history of HTN, HL, DM and CHF presenting with shortness of breath. Patient has not taken any of her medications for several months and has not been to see her primary care doctor. She reports increased swelling in the bilateral lower extremities, bilateral arms, and a progressively worsening shortness of breath. She has decreased exercise tolerance, exertional dyspnea and orthopnea. When she gets short of breath at night, she sits up, comes to 10, and then is able to lay back down. She states that she occasionally has a central chest discomfort with the "onion taste" in the back of her mouth which she attributes to GERD. She denies any chest pressure or tightness. She denies any recent cough or cold symptoms, fever, chills, diaphoresis, nausea or vomiting or diarrhea.   Past Medical History  Diagnosis Date  . Hypertension   . CHF (congestive heart failure) (HCC)   . Hyperlipemia   . Diabetes mellitus without complication (HCC)     There are no active problems to display for this patient.   Past Surgical History  Procedure Laterality Date  . Abdominal hysterectomy      No current outpatient prescriptions on file.  Allergies Review of patient's allergies indicates no known allergies.  No family history on file.  Social History Social History  Substance Use Topics  . Smoking status: Never Smoker   . Smokeless tobacco: None  . Alcohol Use: No    Review of Systems Constitutional: No fever/chills. No lightheadedness or syncope. Eyes: No visual changes. ENT: No sore throat. Cardiovascular: Positive chest pain, negative palpitations. Respiratory: Positive  shortness of breath.  No cough. Gastrointestinal: No abdominal pain.  No nausea, no vomiting.  No diarrhea.  No constipation. Genitourinary: Negative for dysuria. Musculoskeletal: Negative for back pain. Positive bilateral lower extremity edema. No calf pain. Skin: Negative for rash. Neurological: Negative for headaches, focal weakness or numbness.  10-point ROS otherwise negative.  ____________________________________________   PHYSICAL EXAM:  VITAL SIGNS: ED Triage Vitals  Enc Vitals Group     BP 09/01/15 1134 162/84 mmHg     Pulse Rate 09/01/15 1134 89     Resp 09/01/15 1134 20     Temp 09/01/15 1134 98 F (36.7 C)     Temp Source 09/01/15 1134 Oral     SpO2 09/01/15 1134 80 %     Weight 09/01/15 1134 210 lb (95.255 kg)     Height 09/01/15 1134  (1.676 m)     Head Cir --      Peak Flow --      Pain Score --      Pain Loc --      Pain Edu? --      Excl. in GC? --     Constitutional: Patient is alert and oriented and answering questions appropriate. She is not toxic appearing and is chronically ill-appearing.  Eyes: Conjunctivae are normal.  EOMI. no scleral icterus. No discharge. Head: Atraumatic. Nose: No congestion/rhinnorhea. Mouth/Throat: Mucous membranes are moist.  Neck: No stridor.  Supple.  No JVD. Cardiovascular: Normal rate, regular rhythm. No murmurs, rubs or gallops.  Respiratory: Patient is tachypnea without accessory muscle use or retractions. She has poor to fair air exchange with  Rales more than halfway through the lung fields. No wheezes or rhonchi. Gastrointestinal: Obese. Soft and nontender. No distention. No peritoneal signs. Musculoskeletal: Bilateral symmetric lower extremity edema that is pitting to the mid thigh. No palpable cords or tenderness palpation in the calves. Negative Homans sign. Bilateral nonpitting edema in the upper extremities as well. Neurologic:  Normal speech and language. No gross focal neurologic deficits are appreciated.   Skin:  Skin is warm, dry and intact. No rash noted. Psychiatric: Mood and affect are normal. Speech and behavior are normal.  Normal judgement.  ____________________________________________   LABS (all labs ordered are listed, but only abnormal results are displayed)  Labs Reviewed  BASIC METABOLIC PANEL - Abnormal; Notable for the following:    Glucose, Bld 158 (*)    BUN 25 (*)    Calcium 8.6 (*)    GFR calc non Af Amer 49 (*)    GFR calc Af Amer 57 (*)    All other components within normal limits  CBC - Abnormal; Notable for the following:    MCHC 31.1 (*)    RDW 19.8 (*)    All other components within normal limits  TROPONIN I - Abnormal; Notable for the following:    Troponin I 0.04 (*)    All other components within normal limits  BRAIN NATRIURETIC PEPTIDE - Abnormal; Notable for the following:    B Natriuretic Peptide 815.0 (*)    All other components within normal limits   ____________________________________________  EKG  ED ECG REPORT I, Rockne Menghini, the attending physician, personally viewed and interpreted this ECG.   Date: 09/01/2015  EKG Time: 1143  Rate: 151  Rhythm: sinus tachycardia  Axis: Normal  Intervals:Prolonged QTC  ST&T Change: No ST changes. EKG is consistent with SVT; this rate was self-limited.   Interpretation of second EKG: ED ECG REPORT I, Rockne Menghini, the attending physician, personally viewed and interpreted this ECG.   Date: 09/01/2015  EKG Time: 1144  Rate: 85  Rhythm: normal sinus rhythm  Axis: Normal  Intervals:None  ST&T Change: No ST elevation. Positive PVC. Nonspecific T-wave inversions in V1, V2, and V3.     ____________________________________________  RADIOLOGY  Dg Chest 2 View  09/01/2015  CLINICAL DATA:  One month history of shortness of breath EXAM: CHEST  2 VIEW COMPARISON:  April 08, 2007 FINDINGS: There is scarring in the left base region. There is a calcified granuloma in the right  lower lobe. There is no demonstrable edema or consolidation. There is cardiomegaly with mild pulmonary venous hypertension. There is degenerative change in the thoracic spine and both shoulders. No apparent adenopathy. IMPRESSION: Evidence of pulmonary vascular congestion. No frank edema or consolidation. Scarring left base. Calcified granuloma on right. Electronically Signed   By: Bretta Bang III M.D.   On: 09/01/2015 12:39    ____________________________________________   PROCEDURES  Procedure(s) performed: None  Critical Care performed: No ____________________________________________   INITIAL IMPRESSION / ASSESSMENT AND PLAN / ED COURSE  Pertinent labs & imaging results that were available during my care of the patient were reviewed by me and considered in my medical decision making (see chart for details).  82 y.o. female with a history of CHF off her medications for several months presenting with fluid overload symptoms. I am concerned about acute on chronic CHF, we'll also evaluate for ACS or MI. She does not give a history that is consistent with pneumonia. It is much less likely that she has PE.  -----------------------------------------  12:49 PM on 09/01/2015 -----------------------------------------  Patient does not have a significant amount of consolidation or chest x-ray, but is fluid overloaded in her extremities. I have ordered Lasix and her BNP is elevated which is consistent with an acute CHF exacerbation. She also has an elevated troponin which may be from strain, but will need to be trended for possible MI. Patient has had aspirin. Plan admission to the hospital.  ____________________________________________  FINAL CLINICAL IMPRESSION(S) / ED DIAGNOSES  Final diagnoses:  Acute on chronic congestive heart failure, unspecified congestive heart failure type (HCC)  Elevated troponin  Shortness of breath  Bilateral edema of lower extremity  SVT  (supraventricular tachycardia) (HCC)      NEW MEDICATIONS STARTED DURING THIS VISIT:  New Prescriptions   No medications on file     Rockne Menghini, MD 09/01/15 1250

## 2015-09-01 NOTE — ED Notes (Signed)
Pt c/o increased SOB with fluid in lungs for over a week, BL LE swelling.. States she has been out of her medication for over 2 weeks.Faith Brown

## 2015-09-01 NOTE — Progress Notes (Signed)
Patient arrived to 2A and was originally in NSR. About 15 minutes later tele clerk notified that patient is running SVT in the 130's. No meds ordered to give. Paged Dr. Clent Ridges and MD stated to give patient's regular cardizem. Will give and continue to monitor closely.

## 2015-09-02 DIAGNOSIS — I130 Hypertensive heart and chronic kidney disease with heart failure and stage 1 through stage 4 chronic kidney disease, or unspecified chronic kidney disease: Secondary | ICD-10-CM | POA: Diagnosis not present

## 2015-09-02 LAB — CBC
HCT: 39.9 % (ref 35.0–47.0)
Hemoglobin: 12.5 g/dL (ref 12.0–16.0)
MCH: 29.6 pg (ref 26.0–34.0)
MCHC: 31.3 g/dL — ABNORMAL LOW (ref 32.0–36.0)
MCV: 94.7 fL (ref 80.0–100.0)
PLATELETS: 168 10*3/uL (ref 150–440)
RBC: 4.22 MIL/uL (ref 3.80–5.20)
RDW: 21.9 % — AB (ref 11.5–14.5)
WBC: 3.5 10*3/uL — ABNORMAL LOW (ref 3.6–11.0)

## 2015-09-02 LAB — BASIC METABOLIC PANEL
Anion gap: 5 (ref 5–15)
BUN: 21 mg/dL — AB (ref 6–20)
CALCIUM: 8.3 mg/dL — AB (ref 8.9–10.3)
CO2: 34 mmol/L — AB (ref 22–32)
Chloride: 103 mmol/L (ref 101–111)
Creatinine, Ser: 0.99 mg/dL (ref 0.44–1.00)
GFR calc Af Amer: 57 mL/min — ABNORMAL LOW (ref >60)
GFR, EST NON AFRICAN AMERICAN: 49 mL/min — AB (ref >60)
GLUCOSE: 88 mg/dL (ref 65–99)
Potassium: 4 mmol/L (ref 3.5–5.1)
Sodium: 142 mmol/L (ref 135–145)

## 2015-09-02 LAB — TROPONIN I: Troponin I: 0.04 ng/mL — ABNORMAL HIGH (ref <0.031)

## 2015-09-02 MED ORDER — CEFUROXIME AXETIL 250 MG PO TABS
500.0000 mg | ORAL_TABLET | Freq: Two times a day (BID) | ORAL | Status: DC
  Administered 2015-09-02 – 2015-09-05 (×7): 500 mg via ORAL
  Filled 2015-09-02 (×7): qty 2

## 2015-09-02 MED ORDER — ENOXAPARIN SODIUM 40 MG/0.4ML ~~LOC~~ SOLN
40.0000 mg | SUBCUTANEOUS | Status: DC
  Administered 2015-09-02 – 2015-09-04 (×3): 40 mg via SUBCUTANEOUS
  Filled 2015-09-02 (×3): qty 0.4

## 2015-09-02 NOTE — Care Management Note (Signed)
Case Management Note  Patient Details  Name: Faith Brown MRN: [id number] Date of Birth: [birth year]  Subjective/Objective:    82yo Ms Faith Brown was admitted 08/31/14 with shortness of breath and was diagnosed with CHF exacerbation. Hx: CHF, CABG, Diabetes, HTN. PCP=Dr Beverely Risen. Pharmacy=[facility] in [place]. Ms Faith Brown resides at home with her son. Home assistive equipment includes a rolling walker, a BSC, and a cane. Ms Faith Brown is not on home oxygen and has no home health services. She chose [facility] from a list of providers to be her provider if she is discharged with home health.Case management will follow for discharge planning.                  Action/Plan:   Expected Discharge Date:                  Expected Discharge Plan:     In-House Referral:     Discharge planning Services     Post Acute Care Choice:    Choice offered to:     DME Arranged:    DME Agency:     HH Arranged:    HH Agency:     Status of Service:     Medicare Important Message Given:  Yes Date Medicare IM Given:    Medicare IM give by:    Date Additional Medicare IM Given:    Additional Medicare Important Message give by:     If discussed at Long Length of Stay Meetings, dates discussed:    Additional Comments:  Jidenna Figgs A, RN 09/02/2015, 2:56 PM

## 2015-09-02 NOTE — Progress Notes (Signed)
Patient alert and oriented x4, no complaints at this time. vss at this time. Patient NSR on telemetry. Will continue to assess. Khris Jansson R [place]  

## 2015-09-02 NOTE — Progress Notes (Signed)
[facility] - [place] at [facility]   PATIENT NAME: Nadira Single    MR#:  [id number]  DATE OF BIRTH:  Dec 11, 1924  SUBJECTIVE:   Patient is breathing comfortably. Patient reports no chest pain or tenderness of breath. Patient states she stopped her medications properly 2 months ago.  REVIEW OF SYSTEMS:    Review of Systems  Constitutional: Negative for fever, chills and malaise/fatigue.  HENT: Negative for sore throat.   Eyes: Negative for blurred vision.  Respiratory: Negative for cough, hemoptysis, shortness of breath and wheezing.   Cardiovascular: Negative for chest pain, palpitations and leg swelling.  Gastrointestinal: Negative for nausea, vomiting, abdominal pain, diarrhea and blood in stool.  Genitourinary: Negative for dysuria.  Musculoskeletal: Negative for back pain.  Neurological: Negative for dizziness, tremors and headaches.  Endo/Heme/Allergies: Does not bruise/bleed easily.    Tolerating Diet: Yes      DRUG ALLERGIES:  No Known Allergies  VITALS:  Blood pressure 113/68, pulse 61, temperature 97.8 F (36.6 C), temperature source Oral, resp. rate 20, height  (1.676 m), weight 109.272 kg (240 lb 14.4 oz), SpO2 96 %.  PHYSICAL EXAMINATION:   Physical Exam  Constitutional: She is oriented to person, place, and time and well-developed, well-nourished, and in no distress. No distress.  HENT:  Head: Normocephalic.  Eyes: No scleral icterus.  Neck: Normal range of motion. Neck supple. No JVD present. No tracheal deviation present.  Cardiovascular: Normal rate and regular rhythm.  Exam reveals no gallop and no friction rub.   Murmur heard. Pulmonary/Chest: Effort normal and breath sounds normal. No respiratory distress. She has no wheezes. She has no rales. She exhibits no tenderness.  Abdominal: Soft. Bowel sounds are normal. She exhibits no distension and no mass. There is no tenderness. There is no rebound and no guarding.   Musculoskeletal: Normal range of motion. She exhibits no edema.  Neurological: She is alert and oriented to person, place, and time.  Skin: Skin is warm. No rash noted. No erythema.  Psychiatric: Affect and judgment normal.      LABORATORY PANEL:   CBC  Recent Labs Lab 09/02/15 0634  WBC 3.5*  HGB 12.5  HCT 39.9  PLT 168   ------------------------------------------------------------------------------------------------------------------  Chemistries   Recent Labs Lab 09/02/15 0634  NA 142  K 4.0  CL 103  CO2 34*  GLUCOSE 88  BUN 21*  CREATININE 0.99  CALCIUM 8.3*   ------------------------------------------------------------------------------------------------------------------  Cardiac Enzymes  Recent Labs Lab 09/01/15 1356 09/01/15 2038 09/02/15 0148  TROPONINI 0.04* 0.06* 0.04*   ------------------------------------------------------------------------------------------------------------------  RADIOLOGY:  Dg Chest 2 View  09/01/2015  CLINICAL DATA:  One month history of shortness of breath EXAM: CHEST  2 VIEW COMPARISON:  April 08, 2007 FINDINGS: There is scarring in the left base region. There is a calcified granuloma in the right lower lobe. There is no demonstrable edema or consolidation. There is cardiomegaly with mild pulmonary venous hypertension. There is degenerative change in the thoracic spine and both shoulders. No apparent adenopathy. IMPRESSION: Evidence of pulmonary vascular congestion. No frank edema or consolidation. Scarring left base. Calcified granuloma on right. Electronically Signed   By: Bretta Bang III M.D.   On: 09/01/2015 12:39     ASSESSMENT AND PLAN:    82 year old female with a history of congestive heart failure, CAD status post CABG, diabetes and essential hypertension who presented with CHF exacerbation.  1. Acute CHF exacerbation: Echocardiogram is pending to evaluate if this is diastolic or/and systolic.  Continue  IV Lasix. Appreciate cardiology consultation. Continue to monitor intake and output. Continue to monitor creatinine. Patient subjectively reports she is feeling better.  2. Elevation in troponin: This is due to demand ischemia and not ACS. Follow up on echo [place].  3. Acute respiratory failure with hypoxia: This is due to CHF exacerbation. Wean oxygen as needed.  4. SVT: Patient's heart rates are better controlled. Follow-up on echocardiogram and cardiology consultation. Continue diltiazem.  5. UTI: Continue Ceftin.  6. History of hyperlipidemia: Continue Lipitor.     Management plans discussed with the patient and she is in agreement.  CODE STATUS: full  TOTAL TIME TAKING CARE OF THIS PATIENT: 30 minutes.     POSSIBLE D/C 1-2 days, DEPENDING ON CLINICAL CONDITION.   Aura Bibby M.D on 09/02/2015 at 12:54 PM  Between 7am to 6pm - Pager - [phone] After 6pm go to [email or web address] - password [email or web address]  [facility]  Office  [phone]  CC: Primary care physician; No primary care provider on file.  Note: This dictation was prepared with Dragon dictation along with smaller phrase technology. Any transcriptional errors that result from this process are unintentional.

## 2015-09-02 NOTE — Discharge Instructions (Signed)
Heart Failure Clinic appointment on September 21, 2015 at 10:00am with Clarisa Kindred, FNP. Please call [phone] to reschedule.

## 2015-09-02 NOTE — Progress Notes (Signed)
Faith Brown is a 82 y.o. female  [id number]  Primary Cardiologist: Adrian Blackwater Reason for Consultation: CHF  HPI: 82 year old African-American female with a past medical history of congestive heart failure, hypertension diabetes and CABG presented to the emergency room with shortness of breath orthopnea PND and leg swelling . She was desaturating to 80% in the emergency room and chest x-ray had congestion and questionable SVT at a heart rate 1 50/m on EKG. Right now patient is feeling much better less short of breath.   Review of Systems: No chest pain   Past Medical History  Diagnosis Date  . Hypertension   . CHF (congestive heart failure) (HCC)   . Hyperlipemia   . Diabetes mellitus without complication (HCC)   . Shortness of breath dyspnea     Medications Prior to Admission  Medication Sig Dispense Refill  . atorvastatin (LIPITOR) 20 MG tablet Take 20 mg by mouth daily.    Marland Kitchen diltiazem (DILACOR XR) 240 MG 24 hr capsule Take 240 mg by mouth daily.    Marland Kitchen torsemide (DEMADEX) 20 MG tablet Take 20 mg by mouth daily.       Marland Kitchen antiseptic oral rinse  7 mL Mouth Rinse BID  . atorvastatin  20 mg Oral Daily  . cefUROXime  500 mg Oral BID WC  . diltiazem  240 mg Oral Daily  . furosemide  20 mg Intravenous Q12H  . heparin  5,000 Units Subcutaneous 3 times per day  . Influenza vac split quadrivalent PF  0.5 mL Intramuscular Tomorrow-1000  . sodium chloride  3 mL Intravenous Q12H    Infusions:    No Known Allergies  Social History   Social History  . Marital Status: Married    Spouse Name: N/A  . Number of Children: N/A  . Years of Education: N/A   Occupational History  . Not on file.   Social History Main Topics  . Smoking status: Never Smoker   . Smokeless tobacco: Not on file  . Alcohol Use: No  . Drug Use: Not on file  . Sexual Activity: Not on file   Other Topics Concern  . Not on file   Social History Narrative  . No narrative on file    Family  History  Problem Relation Age of Onset  . Family history unknown: Yes    PHYSICAL EXAM: Filed Vitals:   09/01/15 2004 09/02/15 0455  BP: 149/86 126/61  Pulse: 69 55  Temp: 98.3 F (36.8 C) 98.2 F (36.8 C)  Resp: 22 22     Intake/Output Summary (Last 24 hours) at 09/02/15 0857 Last data filed at 09/02/15 0700  Gross per 24 hour  Intake      0 ml  Output   1650 ml  Net  -1650 ml    General:  Well appearing. No respiratory difficulty HEENT: normal Neck: supple. no JVD. Carotids 2+ bilat; no bruits. No lymphadenopathy or thryomegaly appreciated. Cor: PMI nondisplaced. Regular rate & rhythm. No rubs, gallops or murmurs. Lungs: clear Abdomen: soft, nontender, nondistended. No hepatosplenomegaly. No bruits or masses. Good bowel sounds. Extremities: no cyanosis, clubbing, rash, edema Neuro: alert & oriented x 3, cranial nerves grossly intact. moves all 4 extremities w/o difficulty. Affect pleasant.  ECG: EKG not available in the chart but apparently reported to have SVT 150 bpm per report  Results for orders placed or performed during the hospital encounter of 09/01/15 (from the past 24 hour(s))  Basic metabolic panel  Status: Abnormal   Collection Time: 09/01/15 11:44 AM  Result Value Ref Range   Sodium 143 135 - 145 mmol/L   Potassium 4.0 3.5 - 5.1 mmol/L   Chloride 108 101 - 111 mmol/L   CO2 29 22 - 32 mmol/L   Glucose, Bld 158 (H) 65 - 99 mg/dL   BUN 25 (H) 6 - 20 mg/dL   Creatinine, Ser 1.61 0.44 - 1.00 mg/dL   Calcium 8.6 (L) 8.9 - 10.3 mg/dL   GFR calc non Af Amer 49 (L) >60 mL/min   GFR calc Af Amer 57 (L) >60 mL/min   Anion gap 6 5 - 15  CBC     Status: Abnormal   Collection Time: 09/01/15 11:44 AM  Result Value Ref Range   WBC 4.4 3.6 - 11.0 K/uL   RBC 4.54 3.80 - 5.20 MIL/uL   Hemoglobin 12.9 12.0 - 16.0 g/dL   HCT 09.6 04.5 - 40.9 %   MCV 91.4 80.0 - 100.0 fL   MCH 28.4 26.0 - 34.0 pg   MCHC 31.1 (L) 32.0 - 36.0 g/dL   RDW 81.1 (H) 91.4 - 78.2 %    Platelets 209 150 - 440 K/uL  Troponin I     Status: Abnormal   Collection Time: 09/01/15 11:44 AM  Result Value Ref Range   Troponin I 0.04 (H) <0.031 ng/mL  Brain natriuretic peptide     Status: Abnormal   Collection Time: 09/01/15 11:44 AM  Result Value Ref Range   B Natriuretic Peptide 815.0 (H) 0.0 - 100.0 pg/mL  Troponin I (q 6hr x 3)     Status: Abnormal   Collection Time: 09/01/15  1:56 PM  Result Value Ref Range   Troponin I 0.04 (H) <0.031 ng/mL  Urinalysis complete, with microscopic ([facility] only)     Status: Abnormal   Collection Time: 09/01/15  1:56 PM  Result Value Ref Range   Color, Urine COLORLESS (A) YELLOW   APPearance CLEAR (A) CLEAR   Glucose, UA NEGATIVE NEGATIVE mg/dL   Bilirubin Urine NEGATIVE NEGATIVE   Ketones, ur NEGATIVE NEGATIVE mg/dL   Specific Gravity, Urine 1.004 (L) 1.005 - 1.030   Hgb urine dipstick 1+ (A) NEGATIVE   pH 5.0 5.0 - 8.0   Protein, ur NEGATIVE NEGATIVE mg/dL   Nitrite NEGATIVE NEGATIVE   Leukocytes, UA TRACE (A) NEGATIVE   RBC / HPF 0-5 0 - 5 RBC/hpf   WBC, UA 0-5 0 - 5 WBC/hpf   Bacteria, UA NONE SEEN NONE SEEN   Squamous Epithelial / LPF 0-5 (A) NONE SEEN   Mucous PRESENT    Hyaline Casts, UA PRESENT   Troponin I (q 6hr x 3)     Status: Abnormal   Collection Time: 09/01/15  8:38 PM  Result Value Ref Range   Troponin I 0.06 (H) <0.031 ng/mL  Troponin I (q 6hr x 3)     Status: Abnormal   Collection Time: 09/02/15  1:48 AM  Result Value Ref Range   Troponin I 0.04 (H) <0.031 ng/mL  CBC     Status: Abnormal   Collection Time: 09/02/15  6:34 AM  Result Value Ref Range   WBC 3.5 (L) 3.6 - 11.0 K/uL   RBC 4.22 3.80 - 5.20 MIL/uL   Hemoglobin 12.5 12.0 - 16.0 g/dL   HCT 95.6 21.3 - 08.6 %   MCV 94.7 80.0 - 100.0 fL   MCH 29.6 26.0 - 34.0 pg   MCHC 31.3 (L) 32.0 -  36.0 g/dL   RDW 16.1 (H) 09.6 - 04.5 %   Platelets 168 150 - 440 K/uL  Basic metabolic panel     Status: Abnormal   Collection Time: 09/02/15  6:34 AM  Result Value  Ref Range   Sodium 142 135 - 145 mmol/L   Potassium 4.0 3.5 - 5.1 mmol/L   Chloride 103 101 - 111 mmol/L   CO2 34 (H) 22 - 32 mmol/L   Glucose, Bld 88 65 - 99 mg/dL   BUN 21 (H) 6 - 20 mg/dL   Creatinine, Ser 4.09 0.44 - 1.00 mg/dL   Calcium 8.3 (L) 8.9 - 10.3 mg/dL   GFR calc non Af Amer 49 (L) >60 mL/min   GFR calc Af Amer 57 (L) >60 mL/min   Anion gap 5 5 - 15   Dg Chest 2 View  09/01/2015  CLINICAL DATA:  One month history of shortness of breath EXAM: CHEST  2 VIEW COMPARISON:  April 08, 2007 FINDINGS: There is scarring in the left base region. There is a calcified granuloma in the right lower lobe. There is no demonstrable edema or consolidation. There is cardiomegaly with mild pulmonary venous hypertension. There is degenerative change in the thoracic spine and both shoulders. No apparent adenopathy. IMPRESSION: Evidence of pulmonary vascular congestion. No frank edema or consolidation. Scarring left base. Calcified granuloma on right. Electronically Signed   By: Bretta Bang III M.D.   On: 09/01/2015 12:39     ASSESSMENT AND PLAN: Congestive heart failure most likely secondary to systolic dysfunction due to ischemic cardiomyopathy. We will locate echocardiogram and make further recommendation in the meantime continue Lasix IV.  Llana Deshazo A

## 2015-09-02 NOTE — Evaluation (Signed)
Physical Therapy Evaluation Patient Details Name: Faith Brown MRN: [id number] DOB: April 05, 1925 Today's Date: 09/02/2015   History of Present Illness  presented to ER secondary to progressive LE edema and SOB; admitted with CHF exacerbation.  Clinical Impression  Upon evaluation, patient alert and oriented, follows all commands and demonstrates good insight/safety awareness.  Bilat UE/LE strength generally deconditioned (3+ to 4-/5), but functional for basic transfers and mobility.  Significant edema throughout bilat UEs (3+ pitting) with reports of decreased sensory awareness in bilat feet.  Currently requiring min assist for bed mobility; min assist for sit/stand, basic transfers and short-distance gait (5') with RW from bed/chair.  Requiring supplemental O2 (increased from 2 to 4L with exertion) for oxygenation during activity, though noted with desat to 85% despite increase in O2.  Requires seated rest period and extended recovery time for return to >92% (left back at 2L end of session).  Additional gait distance/activity deferred as a result.  Significant deficits in cardiopulmonary endurance and functional activity tolerance evident; lacks cardiopulmonary endurance necessary to facilitate safe discharge home at this time. Would benefit from skilled PT to address above deficits and promote optimal return to PLOF; recommend transition to STR upon discharge from acute hospitalization.       Follow Up Recommendations SNF    Equipment Recommendations  Rolling walker with 5" wheels    Recommendations for Other Services       Precautions / Restrictions Precautions Precautions: Fall Restrictions Weight Bearing Restrictions: No      Mobility  Bed Mobility Overal bed mobility: Needs Assistance Bed Mobility: Supine to Sit     Supine to sit: Min assist        Transfers Overall transfer level: Needs assistance Equipment used: Rolling walker (2 wheeled) Transfers: Sit to/from  Stand Sit to Stand: Min assist         General transfer comment: requires UE support, 'rocking' and heavy use of momentum to complete  Ambulation/Gait Ambulation/Gait assistance: Min assist Ambulation Distance (Feet): 5 Feet Assistive device: Rolling walker (2 wheeled)       General Gait Details: broad BOS with limited step height/length, rather shuffling in stepping performance; poor balance reactions.  Poor activity tolerance.  Unsafe/unable to attempt additional mobility at this time due to desaturation with minimal activity (85% on 4L)  Stairs            Wheelchair Mobility    Modified Rankin (Stroke Patients Only)       Balance Overall balance assessment: Needs assistance Sitting-balance support: No upper extremity supported;Feet supported Sitting balance-Leahy Scale: Good     Standing balance support: Bilateral upper extremity supported Standing balance-Leahy Scale: Fair                               Pertinent Vitals/Pain Pain Assessment: No/denies pain    Home Living Family/patient expects to be discharged to:: Private residence Living Arrangements: Children Available Help at Discharge: Family (son works outside of home) Type of Home: House Home Access: Stairs to enter   Secretary/administrator of Steps: 1 Home Layout: One level Home Equipment: Cane - single point      Prior Function Level of Independence: Independent with assistive device(s)         Comments: Recent use of SPC over previous 2-3 months due to progressive weakness; mod indep with household mobility/ADLs.     Hand Dominance        Extremity/Trunk Assessment  Upper Extremity Assessment: Overall WFL for tasks assessed           Lower Extremity Assessment: Overall WFL for tasks assessed (generally deconditioned with strength at least 3+/5, generalized pitting edema (3+) knees distally)         Communication   Communication: No difficulties  Cognition  Arousal/Alertness: Awake/alert Behavior During Therapy: WFL for tasks assessed/performed Overall Cognitive Status: Within Functional Limits for tasks assessed                      General Comments      Exercises Other Exercises Other Exercises: Seated LE therex, 1x10, AROM for muscular strength/endurance with functional activities      Assessment/Plan    PT Assessment Patient needs continued PT services  PT Diagnosis Difficulty walking;Generalized weakness   PT Problem List Decreased strength;Decreased range of motion;Decreased activity tolerance;Decreased balance;Decreased mobility;Decreased knowledge of use of DME;Decreased safety awareness;Decreased knowledge of precautions;Cardiopulmonary status limiting activity  PT Treatment Interventions DME instruction;Gait training;Stair training;Functional mobility training;Therapeutic activities;Therapeutic exercise;Balance training;Patient/family education   PT Goals (Current goals can be found in the Care Plan section) Acute Rehab PT Goals Patient Stated Goal: "to go home" PT Goal Formulation: With patient Time For Goal Achievement: 09/16/15 Potential to Achieve Goals: Good    Frequency Min 2X/week   Barriers to discharge Decreased caregiver support      Co-evaluation               End of Session Equipment Utilized During Treatment: Gait belt Activity Tolerance: Patient limited by fatigue Patient left: in chair;with call bell/phone within reach;with chair alarm set Nurse Communication: Mobility status (O2 response to activity)         Time: 1440-1502 PT Time Calculation (min) (ACUTE ONLY): 22 min   Charges:   PT Evaluation $PT Eval Moderate Complexity: 1 Procedure PT Treatments $Therapeutic Exercise: 8-22 mins   PT G Codes:        Neyra Pettie H. Manson Passey, PT, DPT, NCS 09/02/2015, 4:45 PM [phone]

## 2015-09-02 NOTE — Care Management Important Message (Signed)
Important Message  Patient Details  Name: Faith Brown MRN: [id number] Date of Birth: 07-02-1925   Medicare Important Message Given:  Yes    Madigan Rosensteel A, RN 09/02/2015, 8:08 AM

## 2015-09-03 ENCOUNTER — Inpatient Hospital Stay
Admit: 2015-09-03 | Discharge: 2015-09-03 | Disposition: A | Discharge status: Home / Self Care | Payer: Medicare Other | Attending: Cardiovascular Disease | Admitting: Cardiovascular Disease

## 2015-09-03 ENCOUNTER — Inpatient Hospital Stay: Payer: Medicare Other

## 2015-09-03 LAB — BASIC METABOLIC PANEL
ANION GAP: 6 (ref 5–15)
BUN: 21 mg/dL — AB (ref 6–20)
CALCIUM: 8.3 mg/dL — AB (ref 8.9–10.3)
CO2: 32 mmol/L (ref 22–32)
Chloride: 106 mmol/L (ref 101–111)
Creatinine, Ser: 0.76 mg/dL (ref 0.44–1.00)
GFR calc Af Amer: >60 mL/min (ref >60)
GFR calc non Af Amer: >60 mL/min (ref >60)
GLUCOSE: 96 mg/dL (ref 65–99)
POTASSIUM: 3.7 mmol/L (ref 3.5–5.1)
Sodium: 144 mmol/L (ref 135–145)

## 2015-09-03 LAB — CBC
HEMATOCRIT: 37.3 % (ref 35.0–47.0)
Hemoglobin: 11.8 g/dL — ABNORMAL LOW (ref 12.0–16.0)
MCH: 29.1 pg (ref 26.0–34.0)
MCHC: 31.7 g/dL — ABNORMAL LOW (ref 32.0–36.0)
MCV: 91.8 fL (ref 80.0–100.0)
Platelets: 166 10*3/uL (ref 150–440)
RBC: 4.07 MIL/uL (ref 3.80–5.20)
RDW: 19.4 % — AB (ref 11.5–14.5)
WBC: 3.6 10*3/uL (ref 3.6–11.0)

## 2015-09-03 MED ORDER — ENALAPRIL MALEATE 5 MG PO TABS
2.5000 mg | ORAL_TABLET | Freq: Every day | ORAL | Status: DC
  Administered 2015-09-03 – 2015-09-05 (×3): 2.5 mg via ORAL
  Filled 2015-09-03 (×3): qty 1

## 2015-09-03 NOTE — Progress Notes (Signed)
[facility] - [place] at [facility]   PATIENT NAME: Faith Brown    MR#:  [id number]  DATE OF BIRTH:  02-19-1925  SUBJECTIVE:   No acute issues overnight. Patient still has some lower extremity edema. Patient still requiring oxygen. Patient denies chest pain or increasing shortness of breath.  REVIEW OF SYSTEMS:    Review of Systems  Constitutional: Negative for fever, chills and malaise/fatigue.  HENT: Negative for sore throat.   Eyes: Negative for blurred vision.  Respiratory: Negative for cough, hemoptysis, shortness of breath and wheezing.   Cardiovascular: Positive for leg swelling. Negative for chest pain and palpitations.  Gastrointestinal: Negative for nausea, vomiting, abdominal pain, diarrhea and blood in stool.  Genitourinary: Negative for dysuria.  Musculoskeletal: Negative for back pain.  Neurological: Negative for dizziness, tremors and headaches.  Endo/Heme/Allergies: Does not bruise/bleed easily.    Tolerating Diet: Yes      DRUG ALLERGIES:  No Known Allergies  VITALS:  Blood pressure 115/59, pulse 58, temperature 97.7 F (36.5 C), temperature source Oral, resp. rate 22, height  (1.676 m), weight 109.272 kg (240 lb 14.4 oz), SpO2 91 %.  PHYSICAL EXAMINATION:   Physical Exam  Constitutional: She is oriented to person, place, and time and well-developed, well-nourished, and in no distress. No distress.  HENT:  Head: Normocephalic.  Eyes: No scleral icterus.  Neck: Normal range of motion. Neck supple. No JVD present. No tracheal deviation present.  Cardiovascular: Normal rate and regular rhythm.  Exam reveals no gallop and no friction rub.   Murmur heard. Pulmonary/Chest: Effort normal and breath sounds normal. No respiratory distress. She has no wheezes. She has no rales. She exhibits no tenderness.  Abdominal: Soft. Bowel sounds are normal. She exhibits no distension and no mass. There is no tenderness. There is no  rebound and no guarding.  Musculoskeletal: Normal range of motion. She exhibits edema.  Neurological: She is alert and oriented to person, place, and time.  Skin: Skin is warm. No rash noted. No erythema.  Psychiatric: Affect and judgment normal.      LABORATORY PANEL:   CBC  Recent Labs Lab 09/03/15 0607  WBC 3.6  HGB 11.8*  HCT 37.3  PLT 166   ------------------------------------------------------------------------------------------------------------------  Chemistries   Recent Labs Lab 09/03/15 0607  NA 144  K 3.7  CL 106  CO2 32  GLUCOSE 96  BUN 21*  CREATININE 0.76  CALCIUM 8.3*   ------------------------------------------------------------------------------------------------------------------  Cardiac Enzymes  Recent Labs Lab 09/01/15 1356 09/01/15 2038 09/02/15 0148  TROPONINI 0.04* 0.06* 0.04*   ------------------------------------------------------------------------------------------------------------------  RADIOLOGY:  Dg Chest 2 View  09/01/2015  CLINICAL DATA:  One month history of shortness of breath EXAM: CHEST  2 VIEW COMPARISON:  April 08, 2007 FINDINGS: There is scarring in the left base region. There is a calcified granuloma in the right lower lobe. There is no demonstrable edema or consolidation. There is cardiomegaly with mild pulmonary venous hypertension. There is degenerative change in the thoracic spine and both shoulders. No apparent adenopathy. IMPRESSION: Evidence of pulmonary vascular congestion. No frank edema or consolidation. Scarring left base. Calcified granuloma on right. Electronically Signed   By: Bretta Bang III M.D.   On: 09/01/2015 12:39     ASSESSMENT AND PLAN:    82 year old female with a history of congestive heart failure, CAD status post CABG, diabetes and essential hypertension who presented with CHF exacerbation.  1. Acute CHF exacerbation: Echocardiogram results are still pending to evaluate if this  is  diastolic or/and systolic. Continue IV Lasix as she continues to have lower extremity edema.. Appreciate cardiology consultation. Continue to monitor intake and output. Continue to monitor creatinine. Patient subjectively reports she is feeling better. Repeat chest x-ray this morning.  2. Elevation in troponin: This is due to demand ischemia and not ACS. Follow up on echo and cardiology consultation. 3. Acute respiratory failure with hypoxia: This is due to CHF exacerbation. Wean oxygen as needed.  4. SVT: Patient's heart rates are better controlled. Continue diltiazem.  5. UTI: Continue Ceftin date 207.  6. History of hyperlipidemia: Continue Lipitor.  Physical therapy recommends skilled nursing facility and clinical social worker consultation has been placed.   Management plans discussed with the patient and she is in agreement.  CODE STATUS: full  TOTAL TIME TAKING CARE OF THIS PATIENT: 30 minutes.     POSSIBLE D/C tomorrow, DEPENDING ON CLINICAL CONDITION.   Gevin Perea M.D on 09/03/2015 at 10:30 AM  Between 7am to 6pm - Pager - [phone] After 6pm go to [email or web address] - password [email or web address] [facility]  [place] [facility]  Office  [phone]  CC: Primary care physician; Lyndon Code, MD  Note: This dictation was prepared with Dragon dictation along with smaller phrase technology. Any transcriptional errors that result from this process are unintentional.

## 2015-09-03 NOTE — Progress Notes (Signed)
SUBJECTIVE: Patient's breathing is much better   Filed Vitals:   09/02/15 1636 09/02/15 1756 09/02/15 1930 09/03/15 0431  BP:  121/61 116/59 115/59  Pulse:  68 69 58  Temp:   98.3 F (36.8 C) 97.7 F (36.5 C)  TempSrc:   Oral Oral  Resp:   24 22  Height:      Weight:      SpO2: 85%  93% 91%    Intake/Output Summary (Last 24 hours) at 09/03/15 1143 Last data filed at 09/03/15 1023  Gross per 24 hour  Intake    480 ml  Output      0 ml  Net    480 ml    LABS: Basic Metabolic Panel:  Recent Labs  08/65/78 0634 09/03/15 0607  NA 142 144  K 4.0 3.7  CL 103 106  CO2 34* 32  GLUCOSE 88 96  BUN 21* 21*  CREATININE 0.99 0.76  CALCIUM 8.3* 8.3*   Liver Function Tests: No results for input(s): AST, ALT, ALKPHOS, BILITOT, PROT, ALBUMIN in the last 72 hours. No results for input(s): LIPASE, AMYLASE in the last 72 hours. CBC:  Recent Labs  09/02/15 0634 09/03/15 0607  WBC 3.5* 3.6  HGB 12.5 11.8*  HCT 39.9 37.3  MCV 94.7 91.8  PLT 168 166   Cardiac Enzymes:  Recent Labs  09/01/15 1356 09/01/15 2038 09/02/15 0148  TROPONINI 0.04* 0.06* 0.04*   BNP: Invalid input(s): POCBNP D-Dimer: No results for input(s): DDIMER in the last 72 hours. Hemoglobin A1C: No results for input(s): HGBA1C in the last 72 hours. Fasting Lipid Panel: No results for input(s): CHOL, HDL, LDLCALC, TRIG, CHOLHDL, LDLDIRECT in the last 72 hours. Thyroid Function Tests: No results for input(s): TSH, T4TOTAL, T3FREE, THYROIDAB in the last 72 hours.  Invalid input(s): FREET3 Anemia Panel: No results for input(s): VITAMINB12, FOLATE, FERRITIN, TIBC, IRON, RETICCTPCT in the last 72 hours.   PHYSICAL EXAM General: Well developed, well nourished, in no acute distress HEENT:  Normocephalic and atramatic Neck:  No JVD.  Lungs: Clear bilaterally to auscultation and percussion. Heart: HRRR . Normal S1 and S2 without gallops or murmurs.  Abdomen: Bowel sounds are positive, abdomen soft  and non-tender  Msk:  Back normal, normal gait. Normal strength and tone for age. Extremities: No clubbing, cyanosis or edema.   Neuro: Alert and oriented X 3. Psych:  Good affect, responds appropriately  TELEMETRY: Sinus rhythm  ASSESSMENT AND PLAN: CHF due to diastolic dysfunction and aortic stenosis along with severe pulmonary hypertension. Continue Lasix IV, diltiazem and will add Ace inhibitors. Due to age patient is not candidate for aortic valve replacement. Will treat the patient conservatively.  Active Problems:   Congestive heart failure (CHF) (HCC)    Shamica Moree A, MD, [facility] 09/03/2015 11:43 AM

## 2015-09-04 LAB — BASIC METABOLIC PANEL
Anion gap: 8 (ref 5–15)
BUN: 17 mg/dL (ref 6–20)
CALCIUM: 8.5 mg/dL — AB (ref 8.9–10.3)
CO2: 34 mmol/L — AB (ref 22–32)
CREATININE: 0.75 mg/dL (ref 0.44–1.00)
Chloride: 102 mmol/L (ref 101–111)
GFR calc non Af Amer: >60 mL/min (ref >60)
Glucose, Bld: 89 mg/dL (ref 65–99)
Potassium: 4.2 mmol/L (ref 3.5–5.1)
Sodium: 144 mmol/L (ref 135–145)

## 2015-09-04 NOTE — Progress Notes (Signed)
SUBJECTIVE: Patient is feeling less short of breath   Filed Vitals:   09/03/15 1150 09/03/15 2030 09/04/15 0545 09/04/15 1203  BP: 130/59 125/61 129/70 123/55  Pulse: 62 60 54 57  Temp: 97.9 F (36.6 C) 98 F (36.7 C) 98.2 F (36.8 C) 97.5 F (36.4 C)  TempSrc: Oral Oral Oral Oral  Resp: Height:      Weight:   237 lb 1.6 oz (107.548 kg)   SpO2: 90% 94% 99% 94%    Intake/Output Summary (Last 24 hours) at 09/04/15 1315 Last data filed at 09/04/15 1016  Gross per 24 hour  Intake    600 ml  Output    950 ml  Net   -350 ml    LABS: Basic Metabolic Panel:  Recent Labs  29/56/21 0607 09/04/15 0547  NA 144 144  K 3.7 4.2  CL 106 102  CO2 32 34*  GLUCOSE 96 89  BUN 21* 17  CREATININE 0.76 0.75  CALCIUM 8.3* 8.5*   Liver Function Tests: No results for input(s): AST, ALT, ALKPHOS, BILITOT, PROT, ALBUMIN in the last 72 hours. No results for input(s): LIPASE, AMYLASE in the last 72 hours. CBC:  Recent Labs  09/02/15 0634 09/03/15 0607  WBC 3.5* 3.6  HGB 12.5 11.8*  HCT 39.9 37.3  MCV 94.7 91.8  PLT 168 166   Cardiac Enzymes:  Recent Labs  09/01/15 1356 09/01/15 2038 09/02/15 0148  TROPONINI 0.04* 0.06* 0.04*   BNP: Invalid input(s): POCBNP D-Dimer: No results for input(s): DDIMER in the last 72 hours. Hemoglobin A1C: No results for input(s): HGBA1C in the last 72 hours. Fasting Lipid Panel: No results for input(s): CHOL, HDL, LDLCALC, TRIG, CHOLHDL, LDLDIRECT in the last 72 hours. Thyroid Function Tests: No results for input(s): TSH, T4TOTAL, T3FREE, THYROIDAB in the last 72 hours.  Invalid input(s): FREET3 Anemia Panel: No results for input(s): VITAMINB12, FOLATE, FERRITIN, TIBC, IRON, RETICCTPCT in the last 72 hours.   PHYSICAL EXAM General: Well developed, well nourished, in no acute distress HEENT:  Normocephalic and atramatic Neck:  No JVD.  Lungs: Clear bilaterally to auscultation and percussion. Heart: HRRR . Normal S1 and  S2 without gallops or murmurs.  Abdomen: Bowel sounds are positive, abdomen soft and non-tender  Msk:  Back normal, normal gait. Normal strength and tone for age. Extremities: No clubbing, cyanosis or edema.   Neuro: Alert and oriented X 3. Psych:  Good affect, responds appropriately  TELEMETRY: Sinus rhythm  ASSESSMENT AND PLAN: CHF with severe aortic stenosis with normal LVEF, and mild diastolic dysfunction. Patient is clinically feeling much better and may go home with follow-up in the office this Thursday at 1:00 Active Problems:   Congestive heart failure (CHF) (HCC)    Adrian Blackwater A, MD, [facility] 09/04/2015 1:15 PM

## 2015-09-04 NOTE — Clinical Social Work Note (Signed)
Clinical Social Work Assessment  Patient Details  Name: Faith Brown MRN: [id number] Date of Birth: Dec 30, 1924  Date of referral:  09/04/15               Reason for consult:  Facility Placement, Other (Comment Required) (Patient refusing SNF )                Permission sought to share information with:  Chartered certified accountant granted to share information::  No  Name::        Agency::     Relationship::     Contact Information:     Housing/Transportation Living arrangements for the past 2 months:  Single Family Home Source of Information:  Patient, Adult Children, Other (Comment Required) Biomedical scientist ) Patient Interpreter Needed:  None Criminal Activity/Legal Involvement Pertinent to Current Situation/Hospitalization:  No - Comment as needed Significant Relationships:  Adult Children, Other Family Members Lives with:  Adult Children Do you feel safe going back to the place where you live?  Yes Need for family participation in patient care:  Yes (Comment)  Care giving concerns:  Patient lives in [place] with her son Fritz Pickerel [phone].    Social Worker assessment / plan:  Holiday representative (CSW) received SNF consult. PT is recommending SNF. CSW met with patient and her son Fritz Pickerel and grandson were at bedside. CSW introduced self and explained role of CSW department. Patient reported that she lives in [place] with her son Fritz Pickerel and her grandson lives there sometimes. CSW explained that PT is recommending SNF. Patient reported that she feels much better today and her mobility is coming back. Patient refused SNF. Son Fritz Pickerel is in agreement with taking patient home. Please reconsult if future social work needs arise. CSW signing off.   Employment status:  Disabled (Comment on whether or not currently receiving Disability), Part-Time Insurance information:  Managed Medicare PT Recommendations:  [place] / Referral to community resources:   [place]  Patient/Family's Response to care:  Patient refused SNF and son Fritz Pickerel is in agreement with patient discharging home.   Patient/Family's Understanding of and Emotional Response to Diagnosis, Current Treatment, and Prognosis:  Patient and son were pleasant throughout assessment.   Emotional Assessment Appearance:  Appears stated age Attitude/Demeanor/Rapport:    Affect (typically observed):  Pleasant, Adaptable Orientation:  Oriented to Self, Oriented to Place, Oriented to  Time, Oriented to Situation Alcohol / Substance use:  Not Applicable Psych involvement (Current and /or in the community):  No (Comment)  Discharge Needs  Concerns to be addressed:  Discharge Planning Concerns Readmission within the last 30 days:  No Current discharge risk:  Dependent with Mobility Barriers to Discharge:  Continued Medical Work up   Loralyn Freshwater, LCSW 09/04/2015, 5:09 PM

## 2015-09-04 NOTE — Progress Notes (Signed)
[facility] - [place] at [facility]   PATIENT NAME: Faith Brown    MR#:  [id number]  DATE OF BIRTH:  December 11, 1924  SUBJECTIVE:   Patient is breathing better and lower extremity edema has improved.  REVIEW OF SYSTEMS:    Review of Systems  Constitutional: Negative for fever, chills and malaise/fatigue.  HENT: Negative for sore throat.   Eyes: Negative for blurred vision.  Respiratory: Negative for cough, hemoptysis, shortness of breath and wheezing.   Cardiovascular: Negative for chest pain, palpitations and leg swelling.  Gastrointestinal: Negative for nausea, vomiting, abdominal pain, diarrhea and blood in stool.  Genitourinary: Negative for dysuria.  Musculoskeletal: Negative for back pain.  Neurological: Negative for dizziness, tremors and headaches.  Endo/Heme/Allergies: Does not bruise/bleed easily.    Tolerating Diet: Yes      DRUG ALLERGIES:  No Known Allergies  VITALS:  Blood pressure 129/70, pulse 54, temperature 98.2 F (36.8 C), temperature source Oral, resp. rate 22, height  (1.676 m), weight 107.548 kg (237 lb 1.6 oz), SpO2 99 %.  PHYSICAL EXAMINATION:   Physical Exam  Constitutional: She is oriented to person, place, and time and well-developed, well-nourished, and in no distress. No distress.  HENT:  Head: Normocephalic.  Eyes: No scleral icterus.  Neck: Normal range of motion. Neck supple. No JVD present. No tracheal deviation present.  Cardiovascular: Normal rate and regular rhythm.  Exam reveals no gallop and no friction rub.   Murmur heard. Pulmonary/Chest: Effort normal and breath sounds normal. No respiratory distress. She has no wheezes. She has no rales. She exhibits no tenderness.  Abdominal: Soft. Bowel sounds are normal. She exhibits no distension and no mass. There is no tenderness. There is no rebound and no guarding.  Musculoskeletal: Normal range of motion. She exhibits no edema.  Neurological: She is  alert and oriented to person, place, and time.  Skin: Skin is warm. No rash noted. No erythema.  Psychiatric: Affect and judgment normal.      LABORATORY PANEL:   CBC  Recent Labs Lab 09/03/15 0607  WBC 3.6  HGB 11.8*  HCT 37.3  PLT 166   ------------------------------------------------------------------------------------------------------------------  Chemistries   Recent Labs Lab 09/04/15 0547  NA 144  K 4.2  CL 102  CO2 34*  GLUCOSE 89  BUN 17  CREATININE 0.75  CALCIUM 8.5*   ------------------------------------------------------------------------------------------------------------------  Cardiac Enzymes  Recent Labs Lab 09/01/15 1356 09/01/15 2038 09/02/15 0148  TROPONINI 0.04* 0.06* 0.04*   ------------------------------------------------------------------------------------------------------------------  RADIOLOGY:  Dg Chest 1 View  09/03/2015  CLINICAL DATA:  History of CHF. EXAM: CHEST 1 VIEW COMPARISON:  Chest x-rays dated 09/01/2015 and 04/08/2007. FINDINGS: Cardiomegaly is stable. There is central pulmonary vascular congestion and bilateral interstitial edema. Left lung base difficult to characterize due to the overlying heart shadow. Degenerative changes again noted at each shoulder. No acute- appearing osseous abnormality. IMPRESSION: Cardiomegaly with continued central pulmonary vascular congestion, and probable mild bilateral interstitial edema, indicating volume overload/CHF. No frank alveolar pulmonary edema seen. Electronically Signed   By: Bary Richard M.D.   On: 09/03/2015 11:30     ASSESSMENT AND PLAN:    82 year old female with a history of congestive heart failure, CAD status post CABG, diabetes and essential hypertension who presented with CHF exacerbation.  1. Acute diastolic CHF exacerbation with severe pulmonary hypertension: Patient is improving with IV Lasix. I will change IV Lasix to oral Lasix tomorrow. Appreciate cardiology  consultation. Patient will need to continue Lasix, enalapril and  diltiazem. Patient will need close follow-up with cardiology.   2. Elevation in troponin: This is due to demand ischemia and not ACS.  3. Acute respiratory failure with hypoxia: This is due to CHF exacerbation. Wean oxygen as needed.  4. SVT: Patient's heart rates are better controlled. Continue diltiazem.  5. UTI: Continue Ceftin. Stop date is Tuesday.  6. History of hyperlipidemia: Continue Lipitor.  Physical therapy recommends skilled nursing facility and clinical social worker consultation has been placed.   Management plans discussed with the patient and she is in agreement.  CODE STATUS: full  TOTAL TIME TAKING CARE OF THIS PATIENT: 28 minutes.     POSSIBLE D/C tomorrow, DEPENDING ON CLINICAL CONDITION.   Johari Bennetts M.D on 09/04/2015 at 10:15 AM  Between 7am to 6pm - Pager - [phone] After 6pm go to [email or web address] - password [email or web address] [facility]  [facility]  Office  [phone]  CC: Primary care physician; Lyndon Code, MD  Note: This dictation was prepared with Dragon dictation along with smaller phrase technology. Any transcriptional errors that result from this process are unintentional.

## 2015-09-05 LAB — BASIC METABOLIC PANEL
Anion gap: 4 — ABNORMAL LOW (ref 5–15)
BUN: 16 mg/dL (ref 6–20)
CHLORIDE: 98 mmol/L — AB (ref 101–111)
CO2: 40 mmol/L — ABNORMAL HIGH (ref 22–32)
CREATININE: 0.69 mg/dL (ref 0.44–1.00)
Calcium: 8.6 mg/dL — ABNORMAL LOW (ref 8.9–10.3)
Glucose, Bld: 98 mg/dL (ref 65–99)
POTASSIUM: 4.2 mmol/L (ref 3.5–5.1)
SODIUM: 142 mmol/L (ref 135–145)

## 2015-09-05 MED ORDER — NITROGLYCERIN 0.4 MG SL SUBL: 0.4000 mg | SUBLINGUAL_TABLET | SUBLINGUAL | Status: DC | PRN

## 2015-09-05 MED ORDER — ATORVASTATIN CALCIUM 20 MG PO TABS: 20.0000 mg | ORAL_TABLET | Freq: Every day | ORAL | Status: DC

## 2015-09-05 MED ORDER — ENALAPRIL MALEATE 2.5 MG PO TABS: 2.5000 mg | ORAL_TABLET | Freq: Every day | ORAL | Status: DC

## 2015-09-05 MED ORDER — CEFUROXIME AXETIL 500 MG PO TABS: 500.0000 mg | ORAL_TABLET | Freq: Two times a day (BID) | ORAL | Status: DC

## 2015-09-05 MED ORDER — TORSEMIDE 20 MG PO TABS: 20.0000 mg | ORAL_TABLET | Freq: Every day | ORAL | Status: DC

## 2015-09-05 MED ORDER — ASPIRIN 81 MG PO CHEW: 81.0000 mg | CHEWABLE_TABLET | Freq: Every day | ORAL | Status: DC

## 2015-09-05 MED ORDER — DILTIAZEM HCL ER COATED BEADS 240 MG PO CP24: 240.0000 mg | ORAL_CAPSULE | Freq: Every day | ORAL | Status: DC

## 2015-09-05 NOTE — Progress Notes (Signed)
SATURATION QUALIFICATIONS: (This note is used to comply with regulatory documentation for home oxygen)  Patient Saturations on Room Air at Rest = 88%  Patient Saturations on Room Air while Ambulating = 80%  Patient Saturations on 2 Liters of oxygen while Ambulating = 91%  Please briefly explain why patient needs home oxygen: Requiring new oxygen this hospitalization.

## 2015-09-05 NOTE — Progress Notes (Signed)
SUBJECTIVE: Feeling much better no chest pain or shortness of breath   Filed Vitals:   09/04/15 1958 09/04/15 2200 09/05/15 0433 09/05/15 0440  BP: 139/66  114/62   Pulse: 61  59   Temp: 97.8 F (36.6 C)     TempSrc: Oral     Resp: 18  18   Height:      Weight:   234 lb 8 oz (106.369 kg)   SpO2: 82% 93% 89% 92%    Intake/Output Summary (Last 24 hours) at 09/05/15 0846 Last data filed at 09/04/15 1834  Gross per 24 hour  Intake    360 ml  Output    100 ml  Net    260 ml    LABS: Basic Metabolic Panel:  Recent Labs  16/10/96 0547 09/05/15 0425  NA 144 142  K 4.2 4.2  CL 102 98*  CO2 34* 40*  GLUCOSE 89 98  BUN 17 16  CREATININE 0.75 0.69  CALCIUM 8.5* 8.6*   Liver Function Tests: No results for input(s): AST, ALT, ALKPHOS, BILITOT, PROT, ALBUMIN in the last 72 hours. No results for input(s): LIPASE, AMYLASE in the last 72 hours. CBC:  Recent Labs  09/03/15 0607  WBC 3.6  HGB 11.8*  HCT 37.3  MCV 91.8  PLT 166   Cardiac Enzymes: No results for input(s): CKTOTAL, CKMB, CKMBINDEX, TROPONINI in the last 72 hours. BNP: Invalid input(s): POCBNP D-Dimer: No results for input(s): DDIMER in the last 72 hours. Hemoglobin A1C: No results for input(s): HGBA1C in the last 72 hours. Fasting Lipid Panel: No results for input(s): CHOL, HDL, LDLCALC, TRIG, CHOLHDL, LDLDIRECT in the last 72 hours. Thyroid Function Tests: No results for input(s): TSH, T4TOTAL, T3FREE, THYROIDAB in the last 72 hours.  Invalid input(s): FREET3 Anemia Panel: No results for input(s): VITAMINB12, FOLATE, FERRITIN, TIBC, IRON, RETICCTPCT in the last 72 hours.   PHYSICAL EXAM General: Well developed, well nourished, in no acute distress HEENT:  Normocephalic and atramatic Neck:  No JVD.  Lungs: Clear bilaterally to auscultation and percussion. Heart: HRRR . Normal S1 and S2 without gallops or murmurs.  Abdomen: Bowel sounds are positive, abdomen soft and non-tender  Msk:  Back  normal, normal gait. Normal strength and tone for age. Extremities: No clubbing, cyanosis or edema.   Neuro: Alert and oriented X 3. Psych:  Good affect, responds appropriately  TELEMETRY: Sinus rhythm  ASSESSMENT AND PLAN: CHF with history of severe aortic stenosis normal left reticular systolic function and mild diastolic dysfunction with moderate mitral regurgitation. Clinically patient is doing much better and will have the patient follow-up in the office next week. Appointment has been given to the patient.  Active Problems:   Congestive heart failure (CHF) (HCC)    Faith Nancy, MD, [facility] 09/05/2015 8:46 AM

## 2015-09-05 NOTE — Progress Notes (Signed)
Discharge instructions given to patient and son. IV and tele removed. Prescriptions sent to pharmacy. Education given on heart failure and new medications. No questions.

## 2015-09-05 NOTE — Care Management Important Message (Signed)
Important Message  Patient Details  Name: Faith Brown MRN: [id number] Date of Birth: 1925-07-22   Medicare Important Message Given:  Yes    Kishon Garriga A, RN 09/05/2015, 8:33 AM

## 2015-09-05 NOTE — Discharge Summary (Addendum)
[facility] - [place] at [facility]   PATIENT NAME: Faith Brown    MR#:  [id number]  DATE OF BIRTH:  1924/12/25  DATE OF ADMISSION:  09/01/2015 ADMITTING PHYSICIAN: Gale Journey, MD  DATE OF DISCHARGE: 09/05/2015  PRIMARY CARE PHYSICIAN: Lyndon Code, MD    ADMISSION DIAGNOSIS:  Shortness of breath [R06.02] SVT (supraventricular tachycardia) (HCC) [I47.1] Hypoxia [R09.02] Elevated troponin [R79.89] Bilateral edema of lower extremity [R60.0] Acute on chronic congestive heart failure, unspecified congestive heart failure type (HCC) [I50.9]  DISCHARGE DIAGNOSIS:  Active Problems:   Congestive heart failure (CHF) (HCC)   SECONDARY DIAGNOSIS:   Past Medical History  Diagnosis Date  . Hypertension   . CHF (congestive heart failure) (HCC)   . Hyperlipemia   . Diabetes mellitus without complication (HCC)   . Shortness of breath dyspnea     HOSPITAL COURSE:   82 year old female with a history of congestive heart failure, CAD status post CABG, diabetes and essential hypertension who presented with CHF exacerbation.  1. Acute diastolic CHF exacerbation with severe pulmonary hypertension and moderate mitral regurgitation: Patient has improved with IV Lasix. I Appreciate cardiology consultation. Patient will need to continue torsemide, enalapril and diltiazem. Patient will need close follow-up with cardiology. She has a scheduled appointment on Tuesday.   2. Elevation in troponin: This is due to demand ischemia and not ACS.  3. Acute respiratory failure with hypoxia: This is due to CHF exacerbation. She will need oxygen at discharge.  4. SVT: Patient's heart rates are better controlled. Continue diltiazem.  5. UTI: She was on Ceftin and will need a few more days therapy.  6. History of hyperlipidemia: Continue Lipitor. Physical therapy recommended skilled nursing facility however patient and patient's on did not want patient to go to skilled  nursing facility she will go home with home health.  DISCHARGE CONDITIONS AND DIET:   And is stable for discharge on a heart healthy diet  CONSULTS OBTAINED:  Treatment Team:  Laurier Nancy, MD  DRUG ALLERGIES:  No Known Allergies  DISCHARGE MEDICATIONS:   Current Discharge Medication List    START taking these medications   Details  aspirin (ASPIRIN CHILDRENS) 81 MG chewable tablet Chew 1 tablet (81 mg total) by mouth daily. Qty: 120 tablet, Refills: 0    cefUROXime (CEFTIN) 500 MG tablet Take 1 tablet (500 mg total) by mouth 2 (two) times daily with a meal. Qty: 10 tablet, Refills: 0    diltiazem (CARDIZEM CD) 240 MG 24 hr capsule Take 1 capsule (240 mg total) by mouth daily. Qty: 30 capsule, Refills: 0    enalapril (VASOTEC) 2.5 MG tablet Take 1 tablet (2.5 mg total) by mouth daily. Qty: 30 tablet, Refills: 0    nitroGLYCERIN (NITROSTAT) 0.4 MG SL tablet Place 1 tablet (0.4 mg total) under the tongue every 5 (five) minutes as needed for chest pain. Qty: 30 tablet, Refills: 0      CONTINUE these medications which have CHANGED   Details  atorvastatin (LIPITOR) 20 MG tablet Take 1 tablet (20 mg total) by mouth daily. Qty: 30 tablet, Refills: 0    torsemide (DEMADEX) 20 MG tablet Take 1 tablet (20 mg total) by mouth daily. Qty: 30 tablet, Refills: 0      STOP taking these medications     diltiazem (DILACOR XR) 240 MG 24 hr capsule               Today   CHIEF COMPLAINT:  Patient  is doing well this morning. She has no lower extremity edema. She has no shortness of breath.   VITAL SIGNS:  Blood pressure 132/69, pulse 59, temperature 97.8 F (36.6 C), temperature source Oral, resp. rate 18, height  (1.676 m), weight 106.369 kg (234 lb 8 oz), SpO2 97 %.   REVIEW OF SYSTEMS:  Review of Systems  Constitutional: Negative for fever, chills and malaise/fatigue.  HENT: Negative for sore throat.   Eyes: Negative for blurred vision.  Respiratory:  Negative for cough, hemoptysis, shortness of breath and wheezing.   Cardiovascular: Negative for chest pain, palpitations and leg swelling.  Gastrointestinal: Negative for nausea, vomiting, abdominal pain, diarrhea and blood in stool.  Genitourinary: Negative for dysuria.  Musculoskeletal: Negative for back pain.  Neurological: Negative for dizziness, tremors and headaches.  Endo/Heme/Allergies: Does not bruise/bleed easily.     PHYSICAL EXAMINATION:  GENERAL:  82 y.o.-year-old patient lying in the bed with no acute distress.  NECK:  Supple, no jugular venous distention. No thyroid enlargement, no tenderness.  LUNGS: Normal breath sounds bilaterally, no wheezing, rales,rhonchi  No use of accessory muscles of respiration.  CARDIOVASCULAR: S1, S2 normal. 3/6SEM NO rubs, or gallops.  ABDOMEN: Soft, non-tender, non-distended. Bowel sounds present. No organomegaly or mass.  EXTREMITIES: No pedal edema, cyanosis, or clubbing.  PSYCHIATRIC: The patient is alert and oriented x 3.  SKIN: No obvious rash, lesion, or ulcer.   DATA REVIEW:   CBC  Recent Labs Lab 09/03/15 0607  WBC 3.6  HGB 11.8*  HCT 37.3  PLT 166    Chemistries   Recent Labs Lab 09/05/15 0425  NA 142  K 4.2  CL 98*  CO2 40*  GLUCOSE 98  BUN 16  CREATININE 0.69  CALCIUM 8.6*    Cardiac Enzymes  Recent Labs Lab 09/01/15 1356 09/01/15 2038 09/02/15 0148  TROPONINI 0.04* 0.06* 0.04*    Microbiology Results  @  RADIOLOGY:  No results found.    Management plans discussed with the patient and she is in agreement. Stable for discharge home with [facility]  Patient should follow up with dr Welton Flakes on Tuesday.  CODE STATUS:     Code Status Orders        Start     Ordered   09/01/15 1550  Full code   Continuous     09/01/15 1549    Code Status History    Date Active Date Inactive Code Status Order ID Comments User Context   This patient has a current code status but no historical code  status.      TOTAL TIME TAKING CARE OF THIS PATIENT: 35 minutes.    Note: This dictation was prepared with Dragon dictation along with smaller phrase technology. Any transcriptional errors that result from this process are unintentional.  Tamsyn Owusu M.D on 09/05/2015 at 12:11 PM  Between 7am to 6pm - Pager - [phone] After 6pm go to [email or web address] - password [email or web address] [facility]  [place]  Office  [phone]  CC: Primary care physician; Lyndon Code, MD

## 2015-09-05 NOTE — Care Management Note (Addendum)
Case Management Note  Patient Details  Name: Faith Brown MRN: [id number] Date of Birth: 08-28-1924  Subjective/Objective:       Discussed discharge planning with [name] who resides at home with her son. [name] chose [facility] to be her home health provider from a list of local providers. [name] says she has used [facility] in the past. This Clinical research associate called Feliberto Gottron at [facility] to update him that a referral is being made to [facility] for RN and PT services.  [name] qualifies for new home oxygen per her oxygen progression measurements. Call to Will Dareen Piano at [facility] to please deliver a portable oxygen tank to her hospital room and set up new oxygen in the home.               Action/Plan:   Expected Discharge Date:                  Expected Discharge Plan:     In-House Referral:     Discharge planning Services     Post Acute Care Choice:    Choice offered to:     DME Arranged:    DME Agency:     HH Arranged:    HH Agency:     Status of Service:     Medicare Important Message Given:  Yes Date Medicare IM Given:    Medicare IM give by:    Date Additional Medicare IM Given:    Additional Medicare Important Message give by:     If discussed at Long Length of Stay Meetings, dates discussed:    Additional Comments:  Sharif Rendell A, RN 09/05/2015, 10:03 AM

## 2015-09-21 ENCOUNTER — Telehealth: Payer: Self-pay | Admitting: Family

## 2015-09-21 ENCOUNTER — Ambulatory Visit: Payer: Medicare Other | Admitting: Family

## 2015-09-21 NOTE — Telephone Encounter (Signed)
Patient did not show for [facility] initial appointment [facility] on 09[facility]/15. Will attempt to reschedule.

## 2017-04-25 ENCOUNTER — Emergency Department: Payer: Medicare Other

## 2017-04-25 ENCOUNTER — Encounter: Payer: Self-pay | Admitting: Emergency Medicine

## 2017-04-25 ENCOUNTER — Emergency Department
Admission: EM | Admit: 2017-04-25 | Discharge: 2017-04-28 | Disposition: A | Discharge status: Home / Self Care | Payer: Medicare Other | Attending: Emergency Medicine | Admitting: Emergency Medicine

## 2017-04-25 DIAGNOSIS — Z9114 Patient's other noncompliance with medication regimen: Secondary | ICD-10-CM | POA: Diagnosis not present

## 2017-04-25 DIAGNOSIS — E119 Type 2 diabetes mellitus without complications: Secondary | ICD-10-CM | POA: Insufficient documentation

## 2017-04-25 DIAGNOSIS — I110 Hypertensive heart disease with heart failure: Secondary | ICD-10-CM | POA: Diagnosis not present

## 2017-04-25 DIAGNOSIS — Z7982 Long term (current) use of aspirin: Secondary | ICD-10-CM | POA: Diagnosis not present

## 2017-04-25 DIAGNOSIS — I509 Heart failure, unspecified: Secondary | ICD-10-CM

## 2017-04-25 DIAGNOSIS — Z951 Presence of aortocoronary bypass graft: Secondary | ICD-10-CM | POA: Insufficient documentation

## 2017-04-25 DIAGNOSIS — R531 Weakness: Secondary | ICD-10-CM | POA: Diagnosis not present

## 2017-04-25 LAB — BASIC METABOLIC PANEL
Anion gap: 8 (ref 5–15)
BUN: 27 mg/dL — ABNORMAL HIGH (ref 6–20)
CALCIUM: 9.1 mg/dL (ref 8.9–10.3)
CO2: 27 mmol/L (ref 22–32)
CREATININE: 0.88 mg/dL (ref 0.44–1.00)
Chloride: 106 mmol/L (ref 101–111)
GFR, EST NON AFRICAN AMERICAN: 56 mL/min — AB (ref >60)
GLUCOSE: 105 mg/dL — AB (ref 65–99)
Potassium: 3.2 mmol/L — ABNORMAL LOW (ref 3.5–5.1)
Sodium: 141 mmol/L (ref 135–145)

## 2017-04-25 LAB — BRAIN NATRIURETIC PEPTIDE: B Natriuretic Peptide: 567 pg/mL — ABNORMAL HIGH (ref 0.0–100.0)

## 2017-04-25 LAB — TROPONIN I: Troponin I: <0.03 ng/mL

## 2017-04-25 LAB — CBC
HEMATOCRIT: 34.4 % — AB (ref 35.0–47.0)
Hemoglobin: 11.3 g/dL — ABNORMAL LOW (ref 12.0–16.0)
MCH: 28.9 pg (ref 26.0–34.0)
MCHC: 32.8 g/dL (ref 32.0–36.0)
MCV: 88 fL (ref 80.0–100.0)
PLATELETS: 187 10*3/uL (ref 150–440)
RBC: 3.91 MIL/uL (ref 3.80–5.20)
RDW: 17 % — AB (ref 11.5–14.5)
WBC: 3.8 10*3/uL (ref 3.6–11.0)

## 2017-04-25 MED ORDER — ATORVASTATIN CALCIUM 20 MG PO TABS
20.0000 mg | ORAL_TABLET | Freq: Every day | ORAL | Status: DC
  Administered 2017-04-25 – 2017-04-26 (×2): 20 mg via ORAL
  Filled 2017-04-25 (×2): qty 1

## 2017-04-25 MED ORDER — TORSEMIDE 20 MG PO TABS
20.0000 mg | ORAL_TABLET | Freq: Every day | ORAL | Status: DC
Start: 2017-04-26 — End: 2017-04-28
  Administered 2017-04-26: 20 mg via ORAL
  Filled 2017-04-25 (×4): qty 1

## 2017-04-25 MED ORDER — DILTIAZEM HCL 100 MG IV SOLR: 20.0000 mg | Freq: Once | INTRAVENOUS | Status: DC

## 2017-04-25 MED ORDER — DILTIAZEM HCL 25 MG/5ML IV SOLN
20.0000 mg | Freq: Once | INTRAVENOUS | Status: AC
  Administered 2017-04-25: 20 mg via INTRAVENOUS
  Filled 2017-04-25: qty 5

## 2017-04-25 MED ORDER — ENALAPRIL MALEATE 2.5 MG PO TABS
2.5000 mg | ORAL_TABLET | Freq: Every day | ORAL | Status: DC
  Administered 2017-04-25 – 2017-04-26 (×2): 2.5 mg via ORAL
  Filled 2017-04-25 (×5): qty 1

## 2017-04-25 MED ORDER — DILTIAZEM HCL ER COATED BEADS 240 MG PO CP24
240.0000 mg | ORAL_CAPSULE | Freq: Once | ORAL | Status: AC
  Administered 2017-04-25: 240 mg via ORAL
  Filled 2017-04-25 (×2): qty 1

## 2017-04-25 MED ORDER — DILTIAZEM HCL ER COATED BEADS 240 MG PO CP24
240.0000 mg | ORAL_CAPSULE | Freq: Every day | ORAL | Status: DC
  Administered 2017-04-26: 240 mg via ORAL
  Filled 2017-04-25 (×4): qty 1

## 2017-04-25 MED ORDER — ASPIRIN 81 MG PO CHEW
81.0000 mg | CHEWABLE_TABLET | Freq: Every day | ORAL | Status: DC
  Administered 2017-04-25 – 2017-04-26 (×2): 81 mg via ORAL
  Filled 2017-04-25 (×2): qty 1

## 2017-04-25 MED ORDER — ACETAMINOPHEN 325 MG PO TABS
650.0000 mg | ORAL_TABLET | Freq: Once | ORAL | Status: AC
  Administered 2017-04-25: 650 mg via ORAL
  Filled 2017-04-25: qty 2

## 2017-04-25 MED ORDER — HYDROXYZINE HCL 25 MG PO TABS
25.0000 mg | ORAL_TABLET | Freq: Once | ORAL | Status: AC
  Administered 2017-04-26: 25 mg via ORAL
  Filled 2017-04-25: qty 1

## 2017-04-25 MED ORDER — FUROSEMIDE 10 MG/ML IJ SOLN
20.0000 mg | Freq: Once | INTRAMUSCULAR | Status: AC
  Administered 2017-04-25: 20 mg via INTRAVENOUS
  Filled 2017-04-25: qty 4
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
## 2017-04-25 NOTE — ED Notes (Signed)
Pt given supper tray and coffee

## 2017-04-25 NOTE — ED Notes (Signed)
Pt expressed that she was sad and depressed over her current situation - she expressed that she had lived a long life and that she did not think she had much time left on this earth - pt denies any pain - pt denies having any needs that I can meet - allowed pt to express concerns and feelings - reassured pt
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
## 2017-04-25 NOTE — ED Notes (Signed)
Pharmacy emailed to send cardizem 

## 2017-04-25 NOTE — ED Notes (Signed)
Per SW Minerva AreolaEric pt has been accepted at [facility] but cannot be transported until tomorrow due to their inability to obtain medications - charge nurse notified and Dr Don PerkingVeronese notified - Dr Don PerkingVeronese will order pt medications for extended stay - spoke with pt and pt son and they understand the POC

## 2017-04-25 NOTE — ED Notes (Signed)
Son Peyton NajjarLarry [phone]

## 2017-04-25 NOTE — Clinical Social Work Note (Addendum)
CSW received consult that patient needs placement for SNF and is currenlty homeless due to eviction.  Patient was living with the son, CSW is looking for placement pending PT recommendations and bed availability.  3:30pm PT is recommending SNF placement for short term rehab, patient was given bed offers, and she chose [facility].  CSW contacted [facility] and they said they can not accept patient tonight because they are not able to get medications until tomorrow, they can accept patient on Friday.  CSW updated bedside nurse.  Ervin KnackEric R. Candice Lunney, MSW, Theresia MajorsLCSWA [phone]  04/25/2017 2:15 PM

## 2017-04-25 NOTE — ED Notes (Signed)
Pt given a cup of coffee and a warm blanket

## 2017-04-25 NOTE — ED Notes (Signed)
Patient transported to X-ray 

## 2017-04-25 NOTE — Clinical Social Work Note (Signed)
Clinical Social Work Assessment  Patient Details  Name: Faith Brown MRN: [id number] Date of Birth: May 06, 1925  Date of referral:  04/25/17               Reason for consult:  Facility Placement, Housing Concerns/Homelessness                Permission sought to share information with:  Facility Medical sales representative, Family Supports Permission granted to share information::  Yes, Verbal Permission Granted  Name::     Zanylah, Hardie  [phone]  [phone]   Agency::  SNF admissions  Relationship::     Contact Information:     Housing/Transportation Living arrangements for the past 2 months:  Single Family Home Source of Information:  Adult Children, Patient Patient Interpreter Needed:  None Criminal Activity/Legal Involvement Pertinent to Current Situation/Hospitalization:  No - Comment as needed Significant Relationships:  Adult Children Lives with:  Adult Children Do you feel safe going back to the place where you live?  No Need for family participation in patient care:  Yes (Comment)  Care giving concerns:  Patient and son feel she needs some short term rehab.  Patient is also currently homeless, her son and her were recently evicted from their home.   Social Worker assessment / plan:  Patient is a 83 year old female who is alert and oriented x4, patient has been to rehab in the past, patient's son was at bedside.  Patient was recently living in a house with her son, but they were just evicted today.  Patient was not very talkative, CSW explained to patient and her son what to expect at SNF.  Patient and her son were referred to the hospital by [employer] shelter due to patient having some medical issues.  Patient and her son were at shelter today, and someone from [employer] brought patient to the hospital to be evaluated.  Patient's son reports she has not been to the doctor's since last year or able to get medications, due to himself having to be hospitalized and  having some medical issues.  CSW was informed that patient has $1200 coming in for social security, and that is main income that they have been living off of.  Patient and her son have been in contact with DSS and [facility] is following them.  CSW inquired what plan is for discharge after SNF, patient's son said he will try to work with DSS and social worker at SNF to find somewhere else to live.  CSW explained that if patient needs SNF as a long term care resident the social worker at SNF can assist with trying to apply for Medicaid.  Patient was explained process for looking for SNF and also explained how insurance will pay for stay.  Patient and her son gave CSW permission to begin bed search process in [place].  Patient and her son did not have any other questions or concerns.     Employment status:  Retired Database administrator PT Recommendations:  Skilled Nursing Facility Information / Referral to community resources:  Skilled Nursing Facility  Patient/Family's Response to care:  Patient and family agreeable to going to SNF for rehab.  Patient/Family's Understanding of and Emotional Response to Diagnosis, Current Treatment, and Prognosis:  Patient and son are hopeful the rehab will help patient and get her strong again.  Emotional Assessment Appearance:  Appears stated age Attitude/Demeanor/Rapport:    Affect (typically observed):  Appropriate, Calm Orientation:  Oriented  to Self, Oriented to Place, Oriented to  Time, Oriented to Situation Alcohol / Substance use:  Not Applicable Psych involvement (Current and /or in the community):  No (Comment)  Discharge Needs  Concerns to be addressed:  Homelessness, Lack of Support Readmission within the last 30 days:  No Current discharge risk:  Lack of support system, Inadequate Financial Supports Barriers to Discharge:  Continued Medical Work up, Other (SNF unable to get medications until tomorrow.)   Darleene Cleavernterhaus,  Tonilynn Bieker R, LCSWA 04/25/2017, 6:01 PM
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
## 2017-04-25 NOTE — Clinical Social Work Placement (Signed)
   CLINICAL SOCIAL WORK PLACEMENT  NOTE  Date:  04/25/2017  Patient Details  Name: Faith Brown MRN: [id number] Date of Birth: 1924/10/06  Clinical Social Work is seeking post-discharge placement for this patient at the Skilled  Nursing Facility level of care (*CSW will initial, date and re-position this form in  chart as items are completed):  Yes   Patient/family provided with [facility] Clinical Social Work Department's list of facilities offering this level of care within the geographic area requested by the patient (or if unable, by the patient's family).  Yes   Patient/family informed of their freedom to choose among providers that offer the needed level of care, that participate in Medicare, Medicaid or managed care program needed by the patient, have an available bed and are willing to accept the patient.  Yes   Patient/family informed of [facility]'s ownership interest in [facility] and [facility], as well as of the fact that they are under no obligation to receive care at these facilities.  PASRR submitted to EDS on 04/25/17     PASRR number received on       Existing PASRR number confirmed on 04/25/17     FL2 transmitted to all facilities in geographic area requested by pt/family on 04/25/17     FL2 transmitted to all facilities within larger geographic area on       Patient informed that his/her managed care company has contracts with or will negotiate with certain facilities, including the following:        Yes   Patient/family informed of bed offers received.  [name]     Physician recommends and patient chooses bed at      Patient to be transferred to [name] on  .  Patient to be transferred to facility by       Patient family notified on   of transfer.  Name of family member notified:        PHYSICIAN Please sign FL2     Additional Comment:     _______________________________________________ Darleene CleaverAnterhaus, Drey Shaff R, LCSWA 04/25/2017, 6:18 PM

## 2017-04-25 NOTE — ED Notes (Signed)
Pt requested to void and external urinary cath placed - pt underwear noted to be stuck to her with urine and feces dried on the underwear and the pt - pt noted to have excoriated skin in the crases between her abd and vaginal area and in bilat creases of her legs with thick white/yellow discharge noted in all creases - pt requested that underwear not be thrown away because it was the only pair that she had

## 2017-04-25 NOTE — ED Notes (Signed)
Pt external cath had stopped suctioning causing her to void in the bed - entire bed linen changed and pt given bed bath with new gown

## 2017-04-25 NOTE — ED Notes (Addendum)
Pt c/o right hip pain and requested pain medication

## 2017-04-25 NOTE — NC FL2 (Signed)
  [place] MEDICAID FL2 LEVEL OF CARE SCREENING TOOL     IDENTIFICATION  Patient Name: Faith JewelLillian Kidney Birthdate: May 27, 1925 Sex: female Admission Date (Current Location): 04/25/2017  [place]   Facility and Address:  [facility], [address], [place], [place] [address]      Provider Number: [id number]  Attending Physician Name and Address:  Nita SickleVeronese, [place], MD  Relative Name and Phone Number:  Fabienne BrunsMorrow,Lawrence C  [phone]  [phone]     Current Level of Care: Hospital Recommended Level of Care: Skilled Nursing Facility Prior Approval Number:    Date Approved/Denied:   PASRR Number: [id number]  Discharge Plan: SNF    Current Diagnoses: Patient Active Problem List   Diagnosis Date Noted  . Congestive heart failure (CHF) (HCC) 09/01/2015    Orientation RESPIRATION BLADDER Height & Weight     Self, Time, Situation, Place  O2 (2L) Indwelling catheter Weight:   Height:     BEHAVIORAL SYMPTOMS/MOOD NEUROLOGICAL BOWEL NUTRITION STATUS      Continent Diet  AMBULATORY STATUS COMMUNICATION OF NEEDS Skin   Limited Assist Verbally Normal                       Personal Care Assistance Level of Assistance  Bathing, Feeding, Dressing Bathing Assistance: Limited assistance Feeding assistance: Independent Dressing Assistance: Limited assistance     Functional Limitations Info  Sight, Hearing, Speech Sight Info: Adequate Hearing Info: Adequate Speech Info: Adequate    SPECIAL CARE FACTORS FREQUENCY  PT (By licensed PT), OT (By licensed OT)     PT Frequency: 5x a week OT Frequency: 5x a week            Contractures Contractures Info: Not present    Additional Factors Info  Code Status Code Status Info: Full Code             Current Medications (04/25/2017):  This is the current hospital active medication list No current facility-administered medications for this encounter.     Current Outpatient Prescriptions  Medication Sig Dispense Refill  . aspirin (ASPIRIN CHILDRENS) 81 MG chewable tablet Chew 1 tablet (81 mg total) by mouth daily. (Patient not taking: Reported on 04/25/2017) 120 tablet 0  . atorvastatin (LIPITOR) 20 MG tablet Take 1 tablet (20 mg total) by mouth daily. (Patient not taking: Reported on 04/25/2017) 30 tablet 0  . cefUROXime (CEFTIN) 500 MG tablet Take 1 tablet (500 mg total) by mouth 2 (two) times daily with a meal. (Patient not taking: Reported on 04/25/2017) 10 tablet 0  . diltiazem (CARDIZEM CD) 240 MG 24 hr capsule Take 1 capsule (240 mg total) by mouth daily. (Patient not taking: Reported on 04/25/2017) 30 capsule 0  . enalapril (VASOTEC) 2.5 MG tablet Take 1 tablet (2.5 mg total) by mouth daily. (Patient not taking: Reported on 04/25/2017) 30 tablet 0  . nitroGLYCERIN (NITROSTAT) 0.4 MG SL tablet Place 1 tablet (0.4 mg total) under the tongue every 5 (five) minutes as needed for chest pain. (Patient not taking: Reported on 04/25/2017) 30 tablet 0  . torsemide (DEMADEX) 20 MG tablet Take 1 tablet (20 mg total) by mouth daily. (Patient not taking: Reported on 04/25/2017) 30 tablet 0     Discharge Medications: Please see discharge summary for a list of discharge medications.  Relevant Imaging Results:  Relevant Lab Results:   Additional Information SSN [id number]  Darleene Cleavernterhaus, Vondell Sowell R, [place]
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
## 2017-04-26 ENCOUNTER — Encounter: Payer: Self-pay | Admitting: Emergency Medicine

## 2017-04-26 MED ORDER — ATORVASTATIN CALCIUM 20 MG PO TABS: 20.0000 mg | ORAL_TABLET | Freq: Every day | ORAL | 0 refills | Status: AC

## 2017-04-26 MED ORDER — ENALAPRIL MALEATE 2.5 MG PO TABS: 2.5000 mg | ORAL_TABLET | Freq: Every day | ORAL | 0 refills | Status: DC

## 2017-04-26 MED ORDER — NITROGLYCERIN 0.4 MG SL SUBL: 0.4000 mg | SUBLINGUAL_TABLET | SUBLINGUAL | 0 refills | Status: AC | PRN

## 2017-04-26 MED ORDER — TORSEMIDE 20 MG PO TABS: 20.0000 mg | ORAL_TABLET | Freq: Every day | ORAL | 0 refills | Status: AC

## 2017-04-26 MED ORDER — ASPIRIN 81 MG PO CHEW: 81.0000 mg | CHEWABLE_TABLET | Freq: Every day | ORAL | 0 refills | Status: AC

## 2017-04-26 MED ORDER — DILTIAZEM HCL ER COATED BEADS 240 MG PO CP24: 240.0000 mg | ORAL_CAPSULE | Freq: Every day | ORAL | 0 refills | Status: DC
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
## 2017-04-26 NOTE — Progress Notes (Signed)
Patient is going to [facility] and will be in room 3A ED Secretary will call report now.  No further needs  Jakaila Norment LCSW

## 2017-04-26 NOTE — Progress Notes (Signed)
LCSW consulted with ED Secretary and informed her patient to be transported by EMS to [facility] and thatcall report number is [phone]  Called [facility] Colin Mulders and currently there some involvement and LCSW patient going  [facility].  Flora Ratz LCSW

## 2017-04-26 NOTE — ED Provider Notes (Signed)
-----------------------------------------   6:21 AM on 04/26/2017 -----------------------------------------  Patient was given hydroxyzine to help her sleep. No events overnight. Patient is to be transported to SNF this morning.   Irean Hong, MD 04/26/17 [phone]

## 2017-04-26 NOTE — ED Notes (Signed)
Pt in NAD and is leaving with [facility] for tx to [facility].

## 2017-04-26 NOTE — Discharge Instructions (Signed)
Heart Failure Clinic appointment on May 03 2017 at 11:00am with Clarisa Kindred, FNP. Please call [phone] to reschedule.

## 2017-04-26 NOTE — ED Notes (Signed)
Pt in NAD at this time. Pt has eaten her breakfast at this time. Nurse helped clean up. Pt denying any needs at this time. Pt's Purewick is draining good at this time. VS are stable.

## 2017-04-26 NOTE — ED Notes (Signed)
Nurse attempted to call report to [facility] and pt's room has not yet been assigned. Gala Romney is to call nurse back in half a hour.

## 2017-04-26 NOTE — Progress Notes (Signed)
LCSW obtained scripts from EDP and faxed them to [facility] at admissions and supported ED secretary for discharge documentation. No further needs.  Doug from [facility] will call CSW with room number. Awaiting call back

## 2017-04-26 NOTE — ED Notes (Signed)
Nurse attempted to call report to [facility].
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
## 2017-05-03 ENCOUNTER — Ambulatory Visit: Payer: Medicare Other | Admitting: Family

## 2017-05-03 ENCOUNTER — Telehealth: Payer: Self-pay | Admitting: Family

## 2017-05-03 NOTE — Telephone Encounter (Signed)
Patient missed her initial appointment at the [facility] Failure Clinic on 05/03/17. Will attempt to reschedule.

## 2017-06-16 ENCOUNTER — Encounter: Payer: Self-pay | Admitting: Emergency Medicine

## 2017-06-16 ENCOUNTER — Emergency Department: Payer: Medicare Other

## 2017-06-16 ENCOUNTER — Other Ambulatory Visit: Payer: Self-pay

## 2017-06-16 ENCOUNTER — Inpatient Hospital Stay
Admission: EM | Admit: 2017-06-16 | Discharge: 2017-06-19 | DRG: 291 | Disposition: A | Discharge status: Skilled Nursing Facility | Payer: Medicare Other | Attending: Internal Medicine | Admitting: Internal Medicine

## 2017-06-16 DIAGNOSIS — R000 Tachycardia, unspecified: Secondary | ICD-10-CM | POA: Diagnosis present

## 2017-06-16 DIAGNOSIS — Z951 Presence of aortocoronary bypass graft: Secondary | ICD-10-CM

## 2017-06-16 DIAGNOSIS — I482 Chronic atrial fibrillation: Secondary | ICD-10-CM | POA: Diagnosis present

## 2017-06-16 DIAGNOSIS — I272 Pulmonary hypertension, unspecified: Secondary | ICD-10-CM | POA: Diagnosis present

## 2017-06-16 DIAGNOSIS — E119 Type 2 diabetes mellitus without complications: Secondary | ICD-10-CM | POA: Diagnosis present

## 2017-06-16 DIAGNOSIS — D649 Anemia, unspecified: Secondary | ICD-10-CM | POA: Diagnosis present

## 2017-06-16 DIAGNOSIS — I480 Paroxysmal atrial fibrillation: Secondary | ICD-10-CM | POA: Diagnosis present

## 2017-06-16 DIAGNOSIS — R0902 Hypoxemia: Secondary | ICD-10-CM | POA: Diagnosis present

## 2017-06-16 DIAGNOSIS — I959 Hypotension, unspecified: Secondary | ICD-10-CM | POA: Diagnosis present

## 2017-06-16 DIAGNOSIS — I110 Hypertensive heart disease with heart failure: Secondary | ICD-10-CM | POA: Diagnosis present

## 2017-06-16 DIAGNOSIS — E785 Hyperlipidemia, unspecified: Secondary | ICD-10-CM | POA: Diagnosis present

## 2017-06-16 DIAGNOSIS — Z90710 Acquired absence of both cervix and uterus: Secondary | ICD-10-CM | POA: Diagnosis not present

## 2017-06-16 DIAGNOSIS — I4891 Unspecified atrial fibrillation: Secondary | ICD-10-CM | POA: Diagnosis present

## 2017-06-16 DIAGNOSIS — Z6832 Body mass index (BMI) 32.0-32.9, adult: Secondary | ICD-10-CM | POA: Diagnosis not present

## 2017-06-16 DIAGNOSIS — I10 Essential (primary) hypertension: Secondary | ICD-10-CM | POA: Diagnosis present

## 2017-06-16 DIAGNOSIS — I081 Rheumatic disorders of both mitral and tricuspid valves: Secondary | ICD-10-CM | POA: Diagnosis present

## 2017-06-16 DIAGNOSIS — Z79899 Other long term (current) drug therapy: Secondary | ICD-10-CM | POA: Diagnosis not present

## 2017-06-16 DIAGNOSIS — L8990 Pressure ulcer of unspecified site, unspecified stage: Secondary | ICD-10-CM

## 2017-06-16 DIAGNOSIS — Z7982 Long term (current) use of aspirin: Secondary | ICD-10-CM | POA: Diagnosis not present

## 2017-06-16 DIAGNOSIS — I5043 Acute on chronic combined systolic (congestive) and diastolic (congestive) heart failure: Secondary | ICD-10-CM | POA: Diagnosis present

## 2017-06-16 DIAGNOSIS — I5033 Acute on chronic diastolic (congestive) heart failure: Secondary | ICD-10-CM | POA: Diagnosis present

## 2017-06-16 DIAGNOSIS — N179 Acute kidney failure, unspecified: Secondary | ICD-10-CM | POA: Diagnosis present

## 2017-06-16 DIAGNOSIS — L89613 Pressure ulcer of right heel, stage 3: Secondary | ICD-10-CM | POA: Diagnosis present

## 2017-06-16 DIAGNOSIS — E669 Obesity, unspecified: Secondary | ICD-10-CM | POA: Diagnosis present

## 2017-06-16 DIAGNOSIS — R0602 Shortness of breath: Secondary | ICD-10-CM | POA: Diagnosis present

## 2017-06-16 DIAGNOSIS — I509 Heart failure, unspecified: Secondary | ICD-10-CM

## 2017-06-16 HISTORY — DX: Unspecified atrial fibrillation: I48.91

## 2017-06-16 LAB — CBC WITH DIFFERENTIAL/PLATELET
BASOS ABS: 0 10*3/uL (ref 0–0.1)
Basophils Relative: 1 %
EOS PCT: 3 %
Eosinophils Absolute: 0.1 10*3/uL (ref 0–0.7)
HEMATOCRIT: 27.9 % — AB (ref 35.0–47.0)
HEMOGLOBIN: 9 g/dL — AB (ref 12.0–16.0)
Lymphocytes Relative: 12 %
Lymphs Abs: 0.6 10*3/uL — ABNORMAL LOW (ref 1.0–3.6)
MCH: 30.2 pg (ref 26.0–34.0)
MCHC: 32.2 g/dL (ref 32.0–36.0)
MCV: 93.8 fL (ref 80.0–100.0)
MONO ABS: 0.8 10*3/uL (ref 0.2–0.9)
MONOS PCT: 16 %
NRBC: 2 /100{WBCs} — AB
Neutro Abs: 3.2 10*3/uL (ref 1.4–6.5)
Neutrophils Relative %: 68 %
Platelets: 243 10*3/uL (ref 150–440)
RBC: 2.97 MIL/uL — ABNORMAL LOW (ref 3.80–5.20)
RDW: 19.7 % — ABNORMAL HIGH (ref 11.5–14.5)
WBC: 4.7 10*3/uL (ref 3.6–11.0)

## 2017-06-16 LAB — COMPREHENSIVE METABOLIC PANEL
ALK PHOS: 68 U/L (ref 38–126)
ALT: 26 U/L (ref 14–54)
AST: 27 U/L (ref 15–41)
Albumin: 2.9 g/dL — ABNORMAL LOW (ref 3.5–5.0)
Anion gap: 7 (ref 5–15)
BILIRUBIN TOTAL: 0.6 mg/dL (ref 0.3–1.2)
BUN: 39 mg/dL — ABNORMAL HIGH (ref 6–20)
CALCIUM: 8.2 mg/dL — AB (ref 8.9–10.3)
CO2: 31 mmol/L (ref 22–32)
CREATININE: 1.35 mg/dL — AB (ref 0.44–1.00)
Chloride: 102 mmol/L (ref 101–111)
GFR calc Af Amer: 39 mL/min — ABNORMAL LOW (ref >60)
GFR, EST NON AFRICAN AMERICAN: 33 mL/min — AB (ref >60)
GLUCOSE: 95 mg/dL (ref 65–99)
POTASSIUM: 4.3 mmol/L (ref 3.5–5.1)
Sodium: 140 mmol/L (ref 135–145)
TOTAL PROTEIN: 7.5 g/dL (ref 6.5–8.1)

## 2017-06-16 LAB — BRAIN NATRIURETIC PEPTIDE: B Natriuretic Peptide: 386 pg/mL — ABNORMAL HIGH (ref 0.0–100.0)

## 2017-06-16 LAB — TROPONIN I: Troponin I: 0.04 ng/mL (ref <0.03)

## 2017-06-16 MED ORDER — DILTIAZEM HCL ER COATED BEADS 240 MG PO CP24
240.0000 mg | ORAL_CAPSULE | Freq: Once | ORAL | Status: AC
  Administered 2017-06-16: 240 mg via ORAL
  Filled 2017-06-16: qty 1

## 2017-06-16 MED ORDER — DILTIAZEM HCL 25 MG/5ML IV SOLN
10.0000 mg | Freq: Once | INTRAVENOUS | Status: AC
  Administered 2017-06-16: 10 mg via INTRAVENOUS
  Filled 2017-06-16: qty 5

## 2017-06-16 MED ORDER — FUROSEMIDE 10 MG/ML IJ SOLN
20.0000 mg | Freq: Once | INTRAMUSCULAR | Status: AC
  Administered 2017-06-16: 20 mg via INTRAVENOUS
  Filled 2017-06-16: qty 4

## 2017-06-16 MED ORDER — IPRATROPIUM-ALBUTEROL 0.5-2.5 (3) MG/3ML IN SOLN
3.0000 mL | Freq: Once | RESPIRATORY_TRACT | Status: AC
  Administered 2017-06-16: 3 mL via RESPIRATORY_TRACT

## 2017-06-16 MED ORDER — IPRATROPIUM-ALBUTEROL 0.5-2.5 (3) MG/3ML IN SOLN
RESPIRATORY_TRACT | Status: AC
  Administered 2017-06-16: 3 mL via RESPIRATORY_TRACT
  Filled 2017-06-16: qty 3

## 2017-06-16 NOTE — ED Notes (Signed)
Date and time results received: 06/16/17 2115 (use smartphrase ".now" to insert current time)  Test: troponin Critical Value: 0.04  Name of Provider Notified: Dr. Alphonzo LemmingsMcShane  Orders Received? Or Actions Taken?: no new orders at this time

## 2017-06-16 NOTE — ED Notes (Signed)
Spoke with Dr. Anne HahnWillis regarding patient's heart rate.  Order given.  Instructed that patient could go to floor after receiving po medication.

## 2017-06-16 NOTE — H&P (Signed)
[facility]   PATIENT NAME: Faith Brown    MR#:  [id number]  DATE OF BIRTH:  1924/12/20  DATE OF ADMISSION:  06/16/2017  PRIMARY CARE PHYSICIAN: Patient, No Pcp Per   REQUESTING/REFERRING PHYSICIAN: Alphonzo Lemmings, MD  CHIEF COMPLAINT:   Chief Complaint  Patient presents with  . Shortness of Breath    HISTORY OF PRESENT ILLNESS:  Faith Brown  is a 83 y.o. female who presents with shortness of breath requiring oxygen in the ED.  She states that her shortness of breath has been building up over the past several days.  Here in the ED her chest x-ray shows pulmonary edema.  She was also in A. fib with RVR.  No clear sign of infection present.  Hospitalist were called for admission  PAST MEDICAL HISTORY:   Past Medical History:  Diagnosis Date  . Atrial fibrillation (HCC)   . CHF (congestive heart failure) (HCC)   . Diabetes mellitus without complication (HCC)   . Hyperlipemia   . Hypertension   . Shortness of breath dyspnea     PAST SURGICAL HISTORY:   Past Surgical History:  Procedure Laterality Date  . ABDOMINAL HYSTERECTOMY    . CORONARY ARTERY BYPASS GRAFT      SOCIAL HISTORY:   Social History   Tobacco Use  . Smoking status: Never Smoker  . Smokeless tobacco: Never Used  Substance Use Topics  . Alcohol use: No    FAMILY HISTORY:   Family History  Family history unknown: Yes    DRUG ALLERGIES:  No Known Allergies  MEDICATIONS AT HOME:   Prior to Admission medications   Medication Sig Start Date End Date Taking? Authorizing Provider  aspirin (ASPIRIN CHILDRENS) 81 MG chewable tablet Chew 1 tablet (81 mg total) by mouth daily. 04/26/17  Yes Emily Filbert, MD  atorvastatin (LIPITOR) 20 MG tablet Take 1 tablet (20 mg total) by mouth daily. 04/26/17  Yes Emily Filbert, MD  diltiazem (CARDIZEM CD) 240 MG 24 hr capsule Take 1 capsule (240 mg total) by mouth daily. 04/26/17  Yes Emily Filbert, MD  enalapril (VASOTEC) 2.5 MG tablet Take 1 tablet (2.5 mg total) by mouth daily. 04/26/17  Yes Emily Filbert, MD  nitroGLYCERIN (NITROSTAT) 0.4 MG SL tablet Place 1 tablet (0.4 mg total) under the tongue every 5 (five) minutes as needed for chest pain. 04/26/17  Yes Emily Filbert, MD  torsemide (DEMADEX) 20 MG tablet Take 1 tablet (20 mg total) by mouth daily. Patient taking differently: Take 20 mg 2 (two) times daily by mouth.  04/26/17  Yes Emily Filbert, MD    REVIEW OF SYSTEMS:  Review of Systems  Constitutional: Negative for chills, fever, malaise/fatigue and weight loss.  HENT: Negative for ear pain, hearing loss and tinnitus.   Eyes: Negative for blurred vision, double vision, pain and redness.  Respiratory: Positive for shortness of breath. Negative for cough and hemoptysis.   Cardiovascular: Positive for leg swelling. Negative for chest pain, palpitations and orthopnea.  Gastrointestinal: Negative for abdominal pain, constipation, diarrhea, nausea and vomiting.  Genitourinary: Negative for dysuria, frequency and hematuria.  Musculoskeletal: Negative for back pain, joint pain and neck pain.  Skin:       No acne, rash, or lesions  Neurological: Negative for dizziness, tremors, focal weakness and weakness.  Endo/Heme/Allergies: Negative for polydipsia. Does not bruise/bleed easily.  Psychiatric/Behavioral: Negative for depression. The patient is not nervous/anxious and does not  have insomnia.      VITAL SIGNS:   Vitals:   06/16/17 2130 06/16/17 2131 06/16/17 2200 06/16/17 2230  BP: 98/71 102/69 99/69 96/70   Pulse: (!) 129 (!) 129 (!) 130 (!) 129  Resp: (!) 22 (!) 25 (!) 22 14  Temp:      TempSrc:      SpO2: 94% 96% 92% 99%  Weight:      Height:       Wt Readings from Last 3 Encounters:  06/16/17 96.2 kg (212 lb)  09/05/15 106.4 kg (234 lb 8 oz)    PHYSICAL EXAMINATION:  Physical Exam  Vitals reviewed. Constitutional: She is oriented to person,  place, and time. She appears well-developed and well-nourished. No distress.  HENT:  Head: Normocephalic and atraumatic.  Mouth/Throat: Oropharynx is clear and moist.  Eyes: Conjunctivae and EOM are normal. Pupils are equal, round, and reactive to light. No scleral icterus.  Neck: Normal range of motion. Neck supple. No JVD present. No thyromegaly present.  Cardiovascular: Intact distal pulses. Exam reveals no gallop and no friction rub.  No murmur heard. Tachycardic, irregular rhythm  Respiratory: Effort normal. No respiratory distress. She has no wheezes. She has rales.  GI: Soft. Bowel sounds are normal. She exhibits no distension. There is no tenderness.  Musculoskeletal: Normal range of motion. She exhibits edema.  No arthritis, no gout  Lymphadenopathy:    She has no cervical adenopathy.  Neurological: She is alert and oriented to person, place, and time. No cranial nerve deficit.  No dysarthria, no aphasia  Skin: Skin is warm and dry. No rash noted. No erythema.  Psychiatric: She has a normal mood and affect. Her behavior is normal. Judgment and thought content normal.    LABORATORY PANEL:   CBC Recent Labs  Lab 06/16/17 2017  WBC 4.7  HGB 9.0*  HCT 27.9*  PLT 243   ------------------------------------------------------------------------------------------------------------------  Chemistries  Recent Labs  Lab 06/16/17 2017  NA 140  K 4.3  CL 102  CO2 31  GLUCOSE 95  BUN 39*  CREATININE 1.35*  CALCIUM 8.2*  AST 27  ALT 26  ALKPHOS 68  BILITOT 0.6   ------------------------------------------------------------------------------------------------------------------  Cardiac Enzymes Recent Labs  Lab 06/16/17 2017  TROPONINI 0.04*   ------------------------------------------------------------------------------------------------------------------  RADIOLOGY:  Dg Chest Port 1 View  Result Date: 06/16/2017 CLINICAL DATA:  Shortness of breath starting  today. Productive cough. History of CHF and hypertension. EXAM: PORTABLE CHEST 1 VIEW COMPARISON:  04/25/2017 FINDINGS: Cardiac enlargement with mild pulmonary vascular congestion. Interstitial changes in the lungs likely represent mild interstitial edema. Small bilateral pleural effusions. Mild progression since previous study. No focal consolidation. Prominent calcification of the aorta. Degenerative changes in the spine and shoulders. No pneumothorax. IMPRESSION: Cardiac enlargement with pulmonary vascular congestion, interstitial edema, and small bilateral pleural effusions demonstrating mild progression since previous study. Electronically Signed   By: Burman NievesWilliam  Stevens M.D.   On: 06/16/2017 21:16    EKG:   Orders placed or performed during the hospital encounter of 06/16/17  . ED EKG  . ED EKG    IMPRESSION AND PLAN:  Principal Problem:   Acute on chronic diastolic CHF (congestive heart failure) (HCC) -IV diuresis given, echocardiogram ordered as well as cardiology consult, continue home meds Active Problems:   Atrial fibrillation with RVR (HCC) -patient received IV calcium channel blocker in the ED with only minimal effect in reducing her heart rate.  However, her blood pressure did fall some with that medication, so we  will give her a p.o. dose at her home dose of Cardizem, continue other home meds   AKI (acute kidney injury) (HCC) -unable to give IV fluids due to the fact that we are actually having to diurese her, suspect this may be some cardiorenal effect given her A. fib and heart failure, treat as above and monitor for improvement   HTN (hypertension) -continue home meds   Diabetes (HCC) -sliding scale insulin with corresponding glucose checks   HLD (hyperlipidemia) -continue home meds  All the records are reviewed and case discussed with ED provider. Management plans discussed with the patient and/or family.  DVT PROPHYLAXIS: SubQ lovenox  GI PROPHYLAXIS: None  ADMISSION  STATUS: Inpatient  CODE STATUS: Full Code Status History    Date Active Date Inactive Code Status Order ID Comments User Context   09/01/2015 15:49 09/05/2015 18:48 Full Code [id number]  Gale Journey, MD Inpatient      TOTAL TIME TAKING CARE OF THIS PATIENT: 45 minutes.   Brydon Spahr FIELDING 06/16/2017, 11:12 PM  [employer]  [phone]  CC: Primary care physician; Patient, No Pcp Per  Note:  This document was prepared using Dragon voice recognition software and may include unintentional dictation errors.

## 2017-06-16 NOTE — ED Triage Notes (Signed)
Pt [facility] EMS report [facility] found pt 89% O2 on 2L. EMS placed pt on 3L with O2 sat 94%. +Cough, productive of phlegm, pt states not sure what color. P130s, irregular. Given IM Lasix earlier today at SNF for fluid in lungs on CXR.

## 2017-06-16 NOTE — ED Provider Notes (Addendum)
[facility] Emergency Department Provider Note  ____________________________________________   I have reviewed the triage vital signs and the nursing notes.   HISTORY  Chief Complaint Shortness of Breath    HPI Faith Brown is a 83 y.o. female who has a history of CHF, diabetes mellitus, hypertension, patient is not normally on oxygen according to EMS.  She became more short of breath today.  They started her on 2 L oxygen was satting at 89% 3 L brought her up to 94.  Patient does state that she has been coughing which is productive of phlegm.  She does have a history of atrial fibrillation heart rate is been somewhat elevated.  Lasix was given at the nursing home for possible CHF.  No reported fever. Did have a cxr today which is reported to have shown only failure    Past Medical History:  Diagnosis Date  . CHF (congestive heart failure) (HCC)   . Diabetes mellitus without complication (HCC)   . Hyperlipemia   . Hypertension   . Shortness of breath dyspnea     Patient Active Problem List   Diagnosis Date Noted  . Congestive heart failure (CHF) (HCC) 09/01/2015    Past Surgical History:  Procedure Laterality Date  . ABDOMINAL HYSTERECTOMY    . CORONARY ARTERY BYPASS GRAFT      Prior to Admission medications   Medication Sig Start Date End Date Taking? Authorizing Provider  aspirin (ASPIRIN CHILDRENS) 81 MG chewable tablet Chew 1 tablet (81 mg total) by mouth daily. 04/26/17   Emily FilbertWilliams, Jonathan E, MD  atorvastatin (LIPITOR) 20 MG tablet Take 1 tablet (20 mg total) by mouth daily. 04/26/17   Emily FilbertWilliams, Jonathan E, MD  diltiazem (CARDIZEM CD) 240 MG 24 hr capsule Take 1 capsule (240 mg total) by mouth daily. 04/26/17   Emily FilbertWilliams, Jonathan E, MD  enalapril (VASOTEC) 2.5 MG tablet Take 1 tablet (2.5 mg total) by mouth daily. 04/26/17   Emily FilbertWilliams, Jonathan E, MD  nitroGLYCERIN (NITROSTAT) 0.4 MG SL tablet Place 1 tablet (0.4 mg total) under the tongue every  5 (five) minutes as needed for chest pain. 04/26/17   Emily FilbertWilliams, Jonathan E, MD  torsemide (DEMADEX) 20 MG tablet Take 1 tablet (20 mg total) by mouth daily. 04/26/17   Emily FilbertWilliams, Jonathan E, MD    Allergies Patient has no known allergies.  Family History  Family history unknown: Yes    Social History Social History   Tobacco Use  . Smoking status: Never Smoker  . Smokeless tobacco: Never Used  Substance Use Topics  . Alcohol use: No  . Drug use: Not on file    Review of Systems Constitutional: No fever/chills Eyes: No visual changes. ENT: No sore throat. No stiff neck no neck pain Cardiovascular: Denies chest pain. Respiratory: Denies shortness of breath. Gastrointestinal:   no vomiting.  No diarrhea.  No constipation. Genitourinary: Negative for dysuria. Musculoskeletal: Negative lower extremity swelling Skin: Negative for rash. Neurological: Negative for severe headaches, focal weakness or numbness.   ____________________________________________   PHYSICAL EXAM:  VITAL SIGNS: ED Triage Vitals  Enc Vitals Group     BP 06/16/17 2015 106/60     Pulse Rate 06/16/17 2020 (!) 131     Resp 06/16/17 2015 (!) 23     Temp 06/16/17 2020 99 F (37.2 C)     Temp Source 06/16/17 2020 Oral     SpO2 06/16/17 2020 (!) 87 %     Weight 06/16/17 2014 212 lb (96.2  kg)     Height 06/16/17 2014 5\' 7"  (1.702 m)     Head Circumference --      Peak Flow --      Pain Score --      Pain Loc --      Pain Edu? --      Excl. in GC? --     Constitutional: Alert and oriented. Well appearing and in no acute distress. Eyes: Conjunctivae are normal Head: Atraumatic HEENT: No congestion/rhinnorhea. Mucous membranes are moist.  Oropharynx non-erythematous Neck:   Nontender with no meningismus, no masses, no stridor Cardiovascular: Tachcardia, irregularly irregular, rhythm. Grossly normal heart sounds.  Good peripheral circulation. Respiratory: Increased respiratory effort initially no  retractions.  Diffuse rales noted  Abdominal: Soft and nontender. No distention. No guarding no rebound Back:  There is no focal tenderness or step off.  there is no midline tenderness there are no lesions noted. there is no CVA tenderness Musculoskeletal: No lower extremity tenderness, no upper extremity tenderness. No joint effusions, no DVT signs strong distal pulses no edema Neurologic:  Normal speech and language. No gross focal neurologic deficits are appreciated.  Skin:  Skin is warm, dry and intact. No rash noted. Psychiatric: Mood and affect are normal. Speech and behavior are normal.  ____________________________________________   LABS (all labs ordered are listed, but only abnormal results are displayed)  Labs Reviewed  CULTURE, BLOOD (ROUTINE X 2)  CULTURE, BLOOD (ROUTINE X 2)  CBC WITH DIFFERENTIAL/PLATELET  TROPONIN I  BRAIN NATRIURETIC PEPTIDE  COMPREHENSIVE METABOLIC PANEL    Pertinent labs  results that were available during my care of the patient were reviewed by me and considered in my medical decision making (see chart for details). ____________________________________________  EKG  I personally interpreted any EKGs ordered by me or triage , No acute ischemic changes nonspecific ST change ____________________________________________  RADIOLOGY  Pertinent labs & imaging results that were available during my care of the patient were reviewed by me and considered in my medical decision making (see chart for details). If possible, patient and/or family made aware of any abnormal findings. ____________________________________________    PROCEDURES  Procedure(s) performed: None  Procedures  Critical Care performed: CRITICAL CARE Performed by: Jeanmarie Plant   Total critical care time: 45 minutes  Critical care time was exclusive of separately billable procedures and treating other patients.  Critical care was necessary to treat or prevent imminent or  life-threatening deterioration.  Critical care was time spent personally by me on the following activities: development of treatment plan with patient and/or surrogate as well as nursing, discussions with consultants, evaluation of patient's response to treatment, examination of patient, obtaining history from patient or surrogate, ordering and performing treatments and interventions, ordering and review of laboratory studies, ordering and review of radiographic studies, pulse oximetry and re-evaluation of patient's condition.   ____________________________________________   INITIAL IMPRESSION / ASSESSMENT AND PLAN / ED COURSE  Pertinent labs & imaging results that were available during my care of the patient were reviewed by me and considered in my medical decision making (see chart for details).  Patient here with shortness of breath and hypoxia unclear etiology multifactorial is possible clinically she appears to be more likely to be infectious, we are giving her DuoNeb she is satting well on 3 L oxygen, respiratory rate is at this time in the low 20s she does not appear to be fatiguing and she is comfortable after being moved from the bed.  She already  received Lasix today for questionable CHF.  We will evaluate for CHF versus infection, she is afebrile here.  We will monitor her closely.  Blood work and chest x-ray are pending.  ----------------------------------------- 9:54 PM on 06/16/2017 -----------------------------------------  Recent comfortable at this time heart rate still in the 120s-130s with A. fib I cannot tell from the notes that she is still taking her diltiazem, give a very small dose of Dilaudid IV to see if we can control her rate which may actually increase her forward squeeze and cardiac efficiency.  We will watch her blood sugar closely while we do so.  We are giving her Lasix, and we will admit her.    ____________________________________________   FINAL CLINICAL  IMPRESSION(S) / ED DIAGNOSES  Final diagnoses:  SOB (shortness of breath)      This chart was dictated using voice recognition software.  Despite best efforts to proofread,  errors can occur which can change meaning.      Jeanmarie Plant, MD 06/16/17 2112    Jeanmarie Plant, MD 06/16/17 1610    Jeanmarie Plant, MD 06/16/17 2155

## 2017-06-17 ENCOUNTER — Inpatient Hospital Stay
Admit: 2017-06-17 | Discharge: 2017-06-17 | Disposition: A | Discharge status: Home / Self Care | Payer: Medicare Other | Attending: Cardiovascular Disease | Admitting: Cardiovascular Disease

## 2017-06-17 ENCOUNTER — Encounter: Payer: Self-pay | Admitting: *Deleted

## 2017-06-17 DIAGNOSIS — L8990 Pressure ulcer of unspecified site, unspecified stage: Secondary | ICD-10-CM

## 2017-06-17 LAB — CBC
HCT: 26.1 % — ABNORMAL LOW (ref 35.0–47.0)
Hemoglobin: 8.3 g/dL — ABNORMAL LOW (ref 12.0–16.0)
MCH: 30.1 pg (ref 26.0–34.0)
MCHC: 32 g/dL (ref 32.0–36.0)
MCV: 94.3 fL (ref 80.0–100.0)
Platelets: 206 K/uL (ref 150–440)
RBC: 2.77 MIL/uL — ABNORMAL LOW (ref 3.80–5.20)
RDW: 19.8 % — ABNORMAL HIGH (ref 11.5–14.5)
WBC: 4 K/uL (ref 3.6–11.0)

## 2017-06-17 LAB — TROPONIN I
Troponin I: 0.04 ng/mL
Troponin I: 0.04 ng/mL

## 2017-06-17 LAB — BASIC METABOLIC PANEL
ANION GAP: 5 (ref 5–15)
BUN: 33 mg/dL — ABNORMAL HIGH (ref 6–20)
CHLORIDE: 103 mmol/L (ref 101–111)
CO2: 33 mmol/L — AB (ref 22–32)
Calcium: 8.2 mg/dL — ABNORMAL LOW (ref 8.9–10.3)
Creatinine, Ser: 0.88 mg/dL (ref 0.44–1.00)
GFR calc non Af Amer: 56 mL/min — ABNORMAL LOW (ref >60)
Glucose, Bld: 94 mg/dL (ref 65–99)
POTASSIUM: 3.8 mmol/L (ref 3.5–5.1)
SODIUM: 141 mmol/L (ref 135–145)

## 2017-06-17 LAB — MRSA PCR SCREENING: MRSA by PCR: NEGATIVE

## 2017-06-17 LAB — GLUCOSE, CAPILLARY
GLUCOSE-CAPILLARY: 101 mg/dL — AB (ref 65–99)
GLUCOSE-CAPILLARY: 103 mg/dL — AB (ref 65–99)
GLUCOSE-CAPILLARY: 117 mg/dL — AB (ref 65–99)
Glucose-Capillary: 98 mg/dL (ref 65–99)
Glucose-Capillary: 94 mg/dL (ref 65–99)

## 2017-06-17 MED ORDER — IPRATROPIUM-ALBUTEROL 0.5-2.5 (3) MG/3ML IN SOLN: 3.0000 mL | RESPIRATORY_TRACT | Status: DC | PRN

## 2017-06-17 MED ORDER — AMIODARONE HCL IN DEXTROSE 360-4.14 MG/200ML-% IV SOLN
30.0000 mg/h | INTRAVENOUS | Status: DC
  Administered 2017-06-17 (×3): 30 mg/h via INTRAVENOUS
  Filled 2017-06-17 (×3): qty 200

## 2017-06-17 MED ORDER — ENALAPRIL MALEATE 5 MG PO TABS
2.5000 mg | ORAL_TABLET | Freq: Every day | ORAL | Status: DC
  Filled 2017-06-17 (×3): qty 1

## 2017-06-17 MED ORDER — ATORVASTATIN CALCIUM 20 MG PO TABS
20.0000 mg | ORAL_TABLET | Freq: Every day | ORAL | Status: DC
  Administered 2017-06-17 – 2017-06-19 (×3): 20 mg via ORAL
  Filled 2017-06-17 (×3): qty 1

## 2017-06-17 MED ORDER — INSULIN ASPART 100 UNIT/ML ~~LOC~~ SOLN: 0.0000 [IU] | Freq: Every day | SUBCUTANEOUS | Status: DC

## 2017-06-17 MED ORDER — DILTIAZEM HCL ER COATED BEADS 120 MG PO CP24
240.0000 mg | ORAL_CAPSULE | Freq: Every day | ORAL | Status: DC
  Filled 2017-06-17: qty 2

## 2017-06-17 MED ORDER — AMIODARONE HCL IN DEXTROSE 360-4.14 MG/200ML-% IV SOLN: 30.0000 mg/h | INTRAVENOUS | Status: DC

## 2017-06-17 MED ORDER — HEPARIN SODIUM (PORCINE) 5000 UNIT/ML IJ SOLN
5000.0000 [IU] | Freq: Three times a day (TID) | INTRAMUSCULAR | Status: DC
  Administered 2017-06-17 – 2017-06-19 (×7): 5000 [IU] via SUBCUTANEOUS
  Filled 2017-06-17 (×7): qty 1

## 2017-06-17 MED ORDER — INSULIN ASPART 100 UNIT/ML ~~LOC~~ SOLN: 0.0000 [IU] | Freq: Three times a day (TID) | SUBCUTANEOUS | Status: DC

## 2017-06-17 MED ORDER — ASPIRIN 81 MG PO CHEW
81.0000 mg | CHEWABLE_TABLET | Freq: Every day | ORAL | Status: DC
  Administered 2017-06-17 – 2017-06-19 (×3): 81 mg via ORAL
  Filled 2017-06-17 (×3): qty 1

## 2017-06-17 MED ORDER — ONDANSETRON HCL 4 MG/2ML IJ SOLN: 4.0000 mg | Freq: Four times a day (QID) | INTRAMUSCULAR | Status: DC | PRN

## 2017-06-17 MED ORDER — ONDANSETRON HCL 4 MG PO TABS: 4.0000 mg | ORAL_TABLET | Freq: Four times a day (QID) | ORAL | Status: DC | PRN

## 2017-06-17 MED ORDER — SODIUM CHLORIDE 0.9% FLUSH
3.0000 mL | Freq: Two times a day (BID) | INTRAVENOUS | Status: DC
  Administered 2017-06-17 – 2017-06-19 (×3): 3 mL via INTRAVENOUS

## 2017-06-17 MED ORDER — ACETAMINOPHEN 325 MG PO TABS
650.0000 mg | ORAL_TABLET | Freq: Four times a day (QID) | ORAL | Status: DC | PRN
  Administered 2017-06-17: 650 mg via ORAL
  Filled 2017-06-17: qty 2

## 2017-06-17 MED ORDER — ACETAMINOPHEN 650 MG RE SUPP: 650.0000 mg | Freq: Four times a day (QID) | RECTAL | Status: DC | PRN

## 2017-06-17 MED ORDER — FUROSEMIDE 10 MG/ML IJ SOLN: 20.0000 mg | Freq: Once | INTRAMUSCULAR | Status: DC

## 2017-06-17 MED ORDER — TORSEMIDE 20 MG PO TABS
20.0000 mg | ORAL_TABLET | Freq: Two times a day (BID) | ORAL | Status: DC
  Administered 2017-06-17 – 2017-06-19 (×5): 20 mg via ORAL
  Filled 2017-06-17 (×5): qty 1

## 2017-06-17 NOTE — Progress Notes (Signed)
*  PRELIMINARY RESULTS* Echocardiogram 2D Echocardiogram has been performed.  Cristela BlueHege, Daysi Boggan 06/17/2017, 2:56 PM

## 2017-06-17 NOTE — Plan of Care (Signed)
Patient has a pressure ulcer on right heel. Patient complains of pain at site. Clean wound as needed to prevent infection.

## 2017-06-17 NOTE — Progress Notes (Signed)
[facility] [place] [facility]   PATIENT NAME: Faith Brown    MR#:  [id number]  DATE OF BIRTH:  17-Oct-1924  SUBJECTIVE:  Came in with increasing shortness of breath found to have shortness of breath.  Patient diuresed well with IV Lasix.  She is feeling a bit anxious.  Still remains tachycardic.  On amiodarone drip.  REVIEW OF SYSTEMS:   Review of Systems  Constitutional: Negative for chills, fever and weight loss.  HENT: Negative for ear discharge, ear pain and nosebleeds.   Eyes: Negative for blurred vision, pain and discharge.  Respiratory: Positive for shortness of breath. Negative for sputum production, wheezing and stridor.   Cardiovascular: Positive for palpitations. Negative for chest pain, orthopnea and PND.  Gastrointestinal: Negative for abdominal pain, diarrhea, nausea and vomiting.  Genitourinary: Negative for frequency and urgency.  Musculoskeletal: Negative for back pain and joint pain.  Neurological: Positive for weakness. Negative for sensory change, speech change and focal weakness.  Psychiatric/Behavioral: Negative for depression and hallucinations. The patient is not nervous/anxious.    Tolerating Diet:yes Tolerating PT: pending  DRUG ALLERGIES:   Allergies  Allergen Reactions  . Lanolin Rash    VITALS:  Blood pressure 97/62, pulse (!) 127, temperature 98.5 F (36.9 C), temperature source Oral, resp. rate 18, height 5\' 6"  (1.676 m), weight 94.3 kg (207 lb 14.4 oz), SpO2 90 %.  PHYSICAL EXAMINATION:   Physical Exam  GENERAL:  83 y.o.-year-old patient lying in the bed with no acute distress.  EYES: Pupils equal, round, reactive to light and accommodation. No scleral icterus. Extraocular muscles intact.  HEENT: Head atraumatic, normocephalic. Oropharynx and nasopharynx clear.  NECK:  Supple, no jugular venous distention. No thyroid enlargement, no tenderness.  LUNGS: decreased breath sounds bilaterally, no wheezing, rales,  rhonchi. No use of accessory muscles of respiration.  CARDIOVASCULAR: S1, S2 normal. No murmurs, rubs, or gallops. tachycardia ABDOMEN: Soft, nontender, nondistended. Bowel sounds present. No organomegaly or mass.  EXTREMITIES: No cyanosis, clubbing or edema b/l.    NEUROLOGIC: Cranial nerves II through XII are intact. No focal Motor or sensory deficits b/l.   PSYCHIATRIC:  patient is alert and oriented x 2 SKIN: No obvious rash, lesion, or ulcer.   LABORATORY PANEL:  CBC Recent Labs  Lab 06/17/17 0811  WBC 4.0  HGB 8.3*  HCT 26.1*  PLT 206    Chemistries  Recent Labs  Lab 06/16/17 2017 06/17/17 0811  NA 140 141  K 4.3 3.8  CL 102 103  CO2 31 33*  GLUCOSE 95 94  BUN 39* 33*  CREATININE 1.35* 0.88  CALCIUM 8.2* 8.2*  AST 27  --   ALT 26  --   ALKPHOS 68  --   BILITOT 0.6  --    Cardiac Enzymes Recent Labs  Lab 06/17/17 0811  TROPONINI 0.04*   RADIOLOGY:  Dg Chest Port 1 View  Result Date: 06/16/2017 CLINICAL DATA:  Shortness of breath starting today. Productive cough. History of CHF and hypertension. EXAM: PORTABLE CHEST 1 VIEW COMPARISON:  04/25/2017 FINDINGS: Cardiac enlargement with mild pulmonary vascular congestion. Interstitial changes in the lungs likely represent mild interstitial edema. Small bilateral pleural effusions. Mild progression since previous study. No focal consolidation. Prominent calcification of the aorta. Degenerative changes in the spine and shoulders. No pneumothorax. IMPRESSION: Cardiac enlargement with pulmonary vascular congestion, interstitial edema, and small bilateral pleural effusions demonstrating mild progression since previous study. Electronically Signed   By: Marisa CyphersWilliam  Stevens M.D.  On: 06/16/2017 21:16   ASSESSMENT AND PLAN:  Faith Brown  is a 83 y.o. female who presents with shortness of breath requiring oxygen in the ED.  She states that her shortness of breath has been building up over the past several days.  Here in the ED  her chest x-ray shows pulmonary edema.  She was also in A. fib with RVR  * Acute on chronic diastolic CHF (congestive heart failure) (HCC)  --IV diuresis with torsemide - echocardiogram ordered - cardiology consult - continue home meds  *Atrial fibrillation with RVR (HCC) -patient received IV calcium channel blocker in the ED with only minimal effect in reducing her heart rate.  However, her blood pressure did fall some with that medication, so we will give her a p.o. dose at her home dose of Cardizem, continue other home meds -iV AMIODARONE gtt   * AKI (acute kidney injury) (HCC) -unable to give IV fluids due to the fact that we are actually having to diurese her, suspect this may be some cardiorenal effect given her A. fib and heart failure, treat as above and monitor for improvement -creat back to baseline of 0.88  * HTN (hypertension) -continue home meds   * Diabetes (HCC) -sliding scale insulin with corresponding glucose checks   * HLD (hyperlipidemia) -continue home meds  Case discussed with Care Management/Social Worker. Management plans discussed with the patient, family and they are in agreement.  CODE STATUS: Full  DVT Prophylaxis: heparin  TOTAL TIME TAKING CARE OF THIS PATIENT: *30* minutes.  >50% time spent on counselling and coordination of care  POSSIBLE D/C IN  1-2 DAYS, DEPENDING ON CLINICAL CONDITION.  Note: This dictation was prepared with Dragon dictation along with smaller phrase technology. Any transcriptional errors that result from this process are unintentional.  Antwione Picotte M.D on 06/17/2017 at 2:48 PM  Between 7am to 6pm - Pager - [phone]  After 6pm go to [email or web address] - password [email or web address]  [facility]  Office  [phone]  CC: Primary care physician; Patient, No Pcp Per

## 2017-06-17 NOTE — Progress Notes (Signed)
Patient heart is in the 130's. MAP is 65, B/P 95/57. Notified Dr. Anne HahnWillis of current condition along. Inquired about iv lasix order and current blood pressure. Received orders to initiate Amiodarone drip and hold iv lasix.

## 2017-06-17 NOTE — Consult Note (Signed)
WOC Nurse wound consult note Reason for Consult:stage 3 pressure injury to right heel.  Patient states this started as a blister some time ago.  Notes this area is tender to touch.  Wound type:stage 3 pressure injury Pressure Injury POA: Yes Measurement: 2 cm x 1 cm x 0.2 cm  Wound bed:25% white 75% pale pink nongranulating Drainage (amount, consistency, odor) minimal serosanguinous Periwound:intact  Dry skin to feet Dressing procedure/placement/frequency:Cleanse with soap and water and moisturize both feet daily. Apply silicone border foam dressing to right heel.  Prevalon boot to right foot to offload pressure.  Patient is alert and oriented and educated not to get out of bed with boot on. Verbalizes understanding.  Will not follow at this time.  Please re-consult if needed.  Maple HudsonKaren Ornella Coderre RN BSN CWON Pager [phone]

## 2017-06-17 NOTE — ED Notes (Signed)
Report called to floor, given to Marcel, RN. 

## 2017-06-17 NOTE — Care Management (Signed)
Patient from  [facility].  Patient is requiring amiodarone drip for atrial fib.

## 2017-06-17 NOTE — Discharge Instructions (Signed)
Heart Failure Clinic appointment on June 27 2017 at 11:20am with Clarisa Kindredina Shayra Anton, FNP. Please call [phone] to reschedule.

## 2017-06-17 NOTE — Consult Note (Signed)
Faith Brown is a 83 y.o. female  [id number]  Primary Cardiologist:Ambria Mayfield Reason for Consultation: CHF/Afib  HPI: 37 YOBF presented with SOB and was was in pulmonary edema, and afib with RVR.   Review of Systems: No chest pains   Past Medical History:  Diagnosis Date  . Atrial fibrillation (HCC)   . CHF (congestive heart failure) (HCC)   . Diabetes mellitus without complication (HCC)   . Hyperlipemia   . Hypertension   . Shortness of breath dyspnea     Medications Prior to Admission  Medication Sig Dispense Refill  . aspirin (ASPIRIN CHILDRENS) 81 MG chewable tablet Chew 1 tablet (81 mg total) by mouth daily. 120 tablet 0  . atorvastatin (LIPITOR) 20 MG tablet Take 1 tablet (20 mg total) by mouth daily. 30 tablet 0  . diltiazem (CARDIZEM CD) 240 MG 24 hr capsule Take 1 capsule (240 mg total) by mouth daily. 30 capsule 0  . enalapril (VASOTEC) 2.5 MG tablet Take 1 tablet (2.5 mg total) by mouth daily. 30 tablet 0  . nitroGLYCERIN (NITROSTAT) 0.4 MG SL tablet Place 1 tablet (0.4 mg total) under the tongue every 5 (five) minutes as needed for chest pain. 30 tablet 0  . torsemide (DEMADEX) 20 MG tablet Take 1 tablet (20 mg total) by mouth daily. (Patient taking differently: Take 20 mg 2 (two) times daily by mouth. ) 30 tablet 0     . aspirin  81 mg Oral Daily  . atorvastatin  20 mg Oral Daily  . diltiazem  240 mg Oral Daily  . enalapril  2.5 mg Oral Daily  . heparin  5,000 Units Subcutaneous Q8H  . insulin aspart  0-5 Units Subcutaneous QHS  . insulin aspart  0-9 Units Subcutaneous TID WC  . torsemide  20 mg Oral BID    Infusions: . amiodarone 30 mg/hr (06/17/17 0222)    Allergies  Allergen Reactions  . Lanolin Rash    Social History   Socioeconomic History  . Marital status: Married    Spouse name: Not on file  . Number of children: Not on file  . Years of education: Not on file  . Highest education level: Not on file  Social Needs  . Financial  resource strain: Not on file  . Food insecurity - worry: Not on file  . Food insecurity - inability: Not on file  . Transportation needs - medical: Not on file  . Transportation needs - non-medical: Not on file  Occupational History  . Not on file  Tobacco Use  . Smoking status: Never Smoker  . Smokeless tobacco: Never Used  Substance and Sexual Activity  . Alcohol use: No  . Drug use: Not on file  . Sexual activity: Not on file  Other Topics Concern  . Not on file  Social History Narrative  . Not on file    Family History  Family history unknown: Yes    PHYSICAL EXAM: Vitals:   06/17/17 0347 06/17/17 0916  BP: 92/63 97/62  Pulse: (!) 130 (!) 127  Resp:  18  Temp: (!) 97.5 F (36.4 C) 98.5 F (36.9 C)  SpO2: 91% 90%     Intake/Output Summary (Last 24 hours) at 06/17/2017 1020 Last data filed at 06/17/2017 1019 Gross per 24 hour  Intake 677.38 ml  Output 350 ml  Net 327.38 ml    General:  Well appearing. No respiratory difficulty HEENT: normal Neck: supple. no JVD. Carotids 2+ bilat; no bruits. No  lymphadenopathy or thryomegaly appreciated. Cor: PMI nondisplaced. Regular rate & rhythm. No rubs, gallops or murmurs. Lungs: clear Abdomen: soft, nontender, nondistended. No hepatosplenomegaly. No bruits or masses. Good bowel sounds. Extremities: no cyanosis, clubbing, rash, edema Neuro: alert & oriented x 3, cranial nerves grossly intact. moves all 4 extremities w/o difficulty. Affect pleasant.  ZOX:WRUEAVECG:unable to see EKG, but monitor has NSR  Results for orders placed or performed during the hospital encounter of 06/16/17 (from the past 24 hour(s))  CBC with Differential     Status: Abnormal   Collection Time: 06/16/17  8:17 PM  Result Value Ref Range   WBC 4.7 3.6 - 11.0 K/uL   RBC 2.97 (L) 3.80 - 5.20 MIL/uL   Hemoglobin 9.0 (L) 12.0 - 16.0 g/dL   HCT 40.927.9 (L) 81.135.0 - 91.447.0 %   MCV 93.8 80.0 - 100.0 fL   MCH 30.2 26.0 - 34.0 pg   MCHC 32.2 32.0 - 36.0 g/dL   RDW  78.219.7 (H) 95.611.5 - 14.5 %   Platelets 243 150 - 440 K/uL   Neutrophils Relative % 68 %   Neutro Abs 3.2 1.4 - 6.5 K/uL   Lymphocytes Relative 12 %   Lymphs Abs 0.6 (L) 1.0 - 3.6 K/uL   Monocytes Relative 16 %   Monocytes Absolute 0.8 0.2 - 0.9 K/uL   Eosinophils Relative 3 %   Eosinophils Absolute 0.1 0 - 0.7 K/uL   Basophils Relative 1 %   Basophils Absolute 0.0 0 - 0.1 K/uL   Smear Review POLYCHROMASIA PRESENT    nRBC 2 (H) 0 /100 WBC  Troponin I     Status: Abnormal   Collection Time: 06/16/17  8:17 PM  Result Value Ref Range   Troponin I 0.04 (HH) <0.03 ng/mL  Brain natriuretic peptide     Status: Abnormal   Collection Time: 06/16/17  8:17 PM  Result Value Ref Range   B Natriuretic Peptide 386.0 (H) 0.0 - 100.0 pg/mL  Comprehensive metabolic panel     Status: Abnormal   Collection Time: 06/16/17  8:17 PM  Result Value Ref Range   Sodium 140 135 - 145 mmol/L   Potassium 4.3 3.5 - 5.1 mmol/L   Chloride 102 101 - 111 mmol/L   CO2 31 22 - 32 mmol/L   Glucose, Bld 95 65 - 99 mg/dL   BUN 39 (H) 6 - 20 mg/dL   Creatinine, Ser 2.131.35 (H) 0.44 - 1.00 mg/dL   Calcium 8.2 (L) 8.9 - 10.3 mg/dL   Total Protein 7.5 6.5 - 8.1 g/dL   Albumin 2.9 (L) 3.5 - 5.0 g/dL   AST 27 15 - 41 U/L   ALT 26 14 - 54 U/L   Alkaline Phosphatase 68 38 - 126 U/L   Total Bilirubin 0.6 0.3 - 1.2 mg/dL   GFR calc non Af Amer 33 (L) >60 mL/min   GFR calc Af Amer 39 (L) >60 mL/min   Anion gap 7 5 - 15  Culture, blood (routine x 2)     Status: None (Preliminary result)   Collection Time: 06/16/17  8:17 PM  Result Value Ref Range   Specimen Description BLOOD LEFT FOREARM    Special Requests      BOTTLES DRAWN AEROBIC AND ANAEROBIC Blood Culture adequate volume   Culture NO GROWTH < 12 HOURS    Report Status PENDING   Culture, blood (routine x 2)     Status: None (Preliminary result)   Collection Time: 06/16/17  8:17  PM  Result Value Ref Range   Specimen Description BLOOD RIGHT ANTECUBITAL    Special  Requests      BOTTLES DRAWN AEROBIC AND ANAEROBIC Blood Culture adequate volume   Culture NO GROWTH < 12 HOURS    Report Status PENDING   Glucose, capillary     Status: None   Collection Time: 06/17/17 12:58 AM  Result Value Ref Range   Glucose-Capillary 98 65 - 99 mg/dL  MRSA PCR Screening     Status: None   Collection Time: 06/17/17 12:59 AM  Result Value Ref Range   MRSA by PCR NEGATIVE NEGATIVE  Troponin I     Status: Abnormal   Collection Time: 06/17/17  2:23 AM  Result Value Ref Range   Troponin I 0.04 (HH) <0.03 ng/mL  Glucose, capillary     Status: None   Collection Time: 06/17/17  7:42 AM  Result Value Ref Range   Glucose-Capillary 94 65 - 99 mg/dL  Troponin I     Status: Abnormal   Collection Time: 06/17/17  8:11 AM  Result Value Ref Range   Troponin I 0.04 (HH) <0.03 ng/mL  Basic metabolic panel     Status: Abnormal   Collection Time: 06/17/17  8:11 AM  Result Value Ref Range   Sodium 141 135 - 145 mmol/L   Potassium 3.8 3.5 - 5.1 mmol/L   Chloride 103 101 - 111 mmol/L   CO2 33 (H) 22 - 32 mmol/L   Glucose, Bld 94 65 - 99 mg/dL   BUN 33 (H) 6 - 20 mg/dL   Creatinine, Ser 1.61 0.44 - 1.00 mg/dL   Calcium 8.2 (L) 8.9 - 10.3 mg/dL   GFR calc non Af Amer 56 (L) >60 mL/min   GFR calc Af Amer >60 >60 mL/min   Anion gap 5 5 - 15  CBC     Status: Abnormal   Collection Time: 06/17/17  8:11 AM  Result Value Ref Range   WBC 4.0 3.6 - 11.0 K/uL   RBC 2.77 (L) 3.80 - 5.20 MIL/uL   Hemoglobin 8.3 (L) 12.0 - 16.0 g/dL   HCT 09.6 (L) 04.5 - 40.9 %   MCV 94.3 80.0 - 100.0 fL   MCH 30.1 26.0 - 34.0 pg   MCHC 32.0 32.0 - 36.0 g/dL   RDW 81.1 (H) 91.4 - 78.2 %   Platelets 206 150 - 440 K/uL   Dg Chest Port 1 View  Result Date: 06/16/2017 CLINICAL DATA:  Shortness of breath starting today. Productive cough. History of CHF and hypertension. EXAM: PORTABLE CHEST 1 VIEW COMPARISON:  04/25/2017 FINDINGS: Cardiac enlargement with mild pulmonary vascular congestion. Interstitial  changes in the lungs likely represent mild interstitial edema. Small bilateral pleural effusions. Mild progression since previous study. No focal consolidation. Prominent calcification of the aorta. Degenerative changes in the spine and shoulders. No pneumothorax. IMPRESSION: Cardiac enlargement with pulmonary vascular congestion, interstitial edema, and small bilateral pleural effusions demonstrating mild progression since previous study. Electronically Signed   By: Burman Nieves M.D.   On: 06/16/2017 21:16     ASSESSMENT AND PLAN: CHF with afib and RVR. Right now in NSR.Hold cardizem as BP is low, continue amiodrone and lasix IV. Klani Caridi A

## 2017-06-18 LAB — ECHOCARDIOGRAM COMPLETE
Height: 66 in
Weight: 3326.4 oz

## 2017-06-18 LAB — GLUCOSE, CAPILLARY
GLUCOSE-CAPILLARY: 106 mg/dL — AB (ref 65–99)
GLUCOSE-CAPILLARY: 95 mg/dL (ref 65–99)
GLUCOSE-CAPILLARY: 95 mg/dL (ref 65–99)
Glucose-Capillary: 85 mg/dL (ref 65–99)

## 2017-06-18 MED ORDER — DILTIAZEM HCL 30 MG PO TABS
60.0000 mg | ORAL_TABLET | Freq: Three times a day (TID) | ORAL | Status: DC
  Administered 2017-06-18 – 2017-06-19 (×4): 60 mg via ORAL
  Filled 2017-06-18 (×4): qty 2

## 2017-06-18 MED ORDER — DIGOXIN 125 MCG PO TABS
0.1250 mg | ORAL_TABLET | Freq: Every day | ORAL | Status: DC
  Administered 2017-06-19: 0.125 mg via ORAL
  Filled 2017-06-18: qty 1

## 2017-06-18 MED ORDER — AMIODARONE HCL 200 MG PO TABS
400.0000 mg | ORAL_TABLET | Freq: Two times a day (BID) | ORAL | Status: DC
  Administered 2017-06-18 – 2017-06-19 (×3): 400 mg via ORAL
  Filled 2017-06-18 (×3): qty 2

## 2017-06-18 MED ORDER — DILTIAZEM HCL 30 MG PO TABS: 60.0000 mg | ORAL_TABLET | Freq: Three times a day (TID) | ORAL | Status: DC

## 2017-06-18 MED ORDER — DIGOXIN 0.25 MG/ML IJ SOLN
0.5000 mg | Freq: Once | INTRAMUSCULAR | Status: AC
  Administered 2017-06-18: 0.5 mg via INTRAVENOUS
  Filled 2017-06-18: qty 2

## 2017-06-18 NOTE — Progress Notes (Signed)
[facility] [place] [facility]   PATIENT NAME: Faith Brown    MR#:  [id number]  DATE OF BIRTH:  1925-03-29  SUBJECTIVE:  Came in with increasing shortness of breath found to have shortness of breath.  Patient diuresed well with IV Lasix.   Still remains tachycardic.  On amiodarone drip.  REVIEW OF SYSTEMS:   Review of Systems  Constitutional: Negative for chills, fever and weight loss.  HENT: Negative for ear discharge, ear pain and nosebleeds.   Eyes: Negative for blurred vision, pain and discharge.  Respiratory: Positive for shortness of breath. Negative for sputum production, wheezing and stridor.   Cardiovascular: Positive for palpitations. Negative for chest pain, orthopnea and PND.  Gastrointestinal: Negative for abdominal pain, diarrhea, nausea and vomiting.  Genitourinary: Negative for frequency and urgency.  Musculoskeletal: Negative for back pain and joint pain.  Neurological: Positive for weakness. Negative for sensory change, speech change and focal weakness.  Psychiatric/Behavioral: Negative for depression and hallucinations. The patient is not nervous/anxious.    Tolerating Diet:yes Tolerating PT: pending  DRUG ALLERGIES:   Allergies  Allergen Reactions  . Lanolin Rash    VITALS:  Blood pressure 105/75, pulse (!) 126, temperature 97.9 F (36.6 C), temperature source Oral, resp. rate 18, height 5\' 6"  (1.676 m), weight 92.3 kg (203 lb 6.4 oz), SpO2 98 %.  PHYSICAL EXAMINATION:   Physical Exam  GENERAL:  83 y.o.-year-old patient lying in the bed with no acute distress.  EYES: Pupils equal, round, reactive to light and accommodation. No scleral icterus. Extraocular muscles intact.  HEENT: Head atraumatic, normocephalic. Oropharynx and nasopharynx clear.  NECK:  Supple, no jugular venous distention. No thyroid enlargement, no tenderness.  LUNGS: decreased breath sounds bilaterally, no wheezing, rales, rhonchi. No use of accessory  muscles of respiration.  CARDIOVASCULAR: S1, S2 normal. No murmurs, rubs, or gallops. Tachycardia + ABDOMEN: Soft, nontender, nondistended. Bowel sounds present. No organomegaly or mass.  EXTREMITIES: No cyanosis, clubbing or edema b/l.    NEUROLOGIC: Cranial nerves II through XII are intact. No focal Motor or sensory deficits b/l.   PSYCHIATRIC:  patient is alert and oriented x 2 SKIN: No obvious rash, lesion, or ulcer.   LABORATORY PANEL:  CBC Recent Labs  Lab 06/17/17 0811  WBC 4.0  HGB 8.3*  HCT 26.1*  PLT 206    Chemistries  Recent Labs  Lab 06/16/17 2017 06/17/17 0811  NA 140 141  K 4.3 3.8  CL 102 103  CO2 31 33*  GLUCOSE 95 94  BUN 39* 33*  CREATININE 1.35* 0.88  CALCIUM 8.2* 8.2*  AST 27  --   ALT 26  --   ALKPHOS 68  --   BILITOT 0.6  --    Cardiac Enzymes Recent Labs  Lab 06/17/17 0811  TROPONINI 0.04*   RADIOLOGY:  Dg Chest Port 1 View  Result Date: 06/16/2017 CLINICAL DATA:  Shortness of breath starting today. Productive cough. History of CHF and hypertension. EXAM: PORTABLE CHEST 1 VIEW COMPARISON:  04/25/2017 FINDINGS: Cardiac enlargement with mild pulmonary vascular congestion. Interstitial changes in the lungs likely represent mild interstitial edema. Small bilateral pleural effusions. Mild progression since previous study. No focal consolidation. Prominent calcification of the aorta. Degenerative changes in the spine and shoulders. No pneumothorax. IMPRESSION: Cardiac enlargement with pulmonary vascular congestion, interstitial edema, and small bilateral pleural effusions demonstrating mild progression since previous study. Electronically Signed   By: Burman NievesWilliam  Stevens M.D.   On: 06/16/2017 21:16  ASSESSMENT AND PLAN:  Faith JewelLillian Pelster  is a 83 y.o. female who presents with shortness of breath requiring oxygen in the ED.  She states that her shortness of breath has been building up over the past several days.  Here in the ED her chest x-ray shows  pulmonary edema.  She was also in A. fib with RVR  * Acute on chronic systolic  CHF (congestive heart failure) (HCC)  --IV diuresis lasix now changed to po torsemide - echocardiogram showed EF of 35% - cardiology consult with Dr Welton Flakeskhan.  Recommends Cardizem 60 mg 3 times daily, p.o. amiodarone 400 milligrams twice daily   *Atrial fibrillation with RVR (HCC) -patient received IV calcium channel blocker in the ED with only minimal effect in reducing her heart rate.  However, her blood pressure did fall some with that medication, so we will give her a p.o. dose at her home dose of Cardizem, continue other home meds -iV AMIODARONE gtt---p.o. amiodarone 400 mg twice daily -We will add IV digoxin load and start patient with 0.5 mg once and 0.125 mg p.o. tomorrow   * AKI (acute kidney injury) (HCC) -unable to give IV fluids due to the fact that we are actually having to diurese her, suspect this may be some cardiorenal effect given her A. fib and heart failure, treat as above and monitor for improvement -creat back to baseline of 0.88  * HTN (hypertension) -continue home meds   * Diabetes (HCC) -sliding scale insulin with corresponding glucose checks   * HLD (hyperlipidemia) -continue home meds  *[facility] is stable Case discussed with Care Management/Social Worker. Management plans discussed with the patient, family and they are in agreement.  CODE STATUS: Full  DVT Prophylaxis: heparin  TOTAL TIME TAKING CARE OF THIS PATIENT: *30* minutes.  >50% time spent on counselling and coordination of care  POSSIBLE D/C IN  1-2 DAYS, DEPENDING ON CLINICAL CONDITION.  Note: This dictation was prepared with Dragon dictation along with smaller phrase technology. Any transcriptional errors that result from this process are unintentional.  Brody Bonneau M.D on 06/18/2017 at 2:57 PM  Between 7am to 6pm - Pager - [phone]  After 6pm go to [email or web address] -  password [email or web address]  [facility]  Office  [phone]  CC: Primary care physician; Patient, No Pcp Per

## 2017-06-18 NOTE — Progress Notes (Signed)
SUBJECTIVE: Pt is feeling well. Mild SOB on oxygen but no increased WOB.   Vitals:   06/17/17 2330 06/17/17 2332 06/18/17 0500 06/18/17 0520  BP:  108/73  123/82  Pulse: (!) 124 (!) 125  (!) 125  Resp:      Temp:      TempSrc:      SpO2: 98% 98%  93%  Weight:   203 lb 6.4 oz (92.3 kg)   Height:        Intake/Output Summary (Last 24 hours) at 06/18/2017 0844 Last data filed at 06/18/2017 0658 Gross per 24 hour  Intake 1363.24 ml  Output 3500 ml  Net -2136.76 ml    LABS: Basic Metabolic Panel: Recent Labs    06/16/17 2017 06/17/17 0811  NA 140 141  K 4.3 3.8  CL 102 103  CO2 31 33*  GLUCOSE 95 94  BUN 39* 33*  CREATININE 1.35* 0.88  CALCIUM 8.2* 8.2*   Liver Function Tests: Recent Labs    06/16/17 2017  AST 27  ALT 26  ALKPHOS 68  BILITOT 0.6  PROT 7.5  ALBUMIN 2.9*   No results for input(s): LIPASE, AMYLASE in the last 72 hours. CBC: Recent Labs    06/16/17 2017 06/17/17 0811  WBC 4.7 4.0  NEUTROABS 3.2  --   HGB 9.0* 8.3*  HCT 27.9* 26.1*  MCV 93.8 94.3  PLT 243 206   Cardiac Enzymes: Recent Labs    06/16/17 2017 06/17/17 0223 06/17/17 0811  TROPONINI 0.04* 0.04* 0.04*   BNP: Invalid input(s): POCBNP D-Dimer: No results for input(s): DDIMER in the last 72 hours. Hemoglobin A1C: No results for input(s): HGBA1C in the last 72 hours. Fasting Lipid Panel: No results for input(s): CHOL, HDL, LDLCALC, TRIG, CHOLHDL, LDLDIRECT in the last 72 hours. Thyroid Function Tests: No results for input(s): TSH, T4TOTAL, T3FREE, THYROIDAB in the last 72 hours.  Invalid input(s): FREET3 Anemia Panel: No results for input(s): VITAMINB12, FOLATE, FERRITIN, TIBC, IRON, RETICCTPCT in the last 72 hours.   PHYSICAL EXAM General: Well developed, well nourished, in no acute distress HEENT:  Normocephalic and atramatic Neck:  No JVD.  Lungs: Wheezing bilaterally with rales Heart: HRRR . Normal S1 and S2 without gallops or murmurs.  Abdomen: Bowel sounds  are positive, abdomen soft and non-tender  Msk:  Back normal, normal gait. Normal strength and tone for age. Extremities: No clubbing, cyanosis or edema.   Neuro: Alert and oriented X 3. Psych:  Good affect, responds appropriately  TELEMETRY: Sinus tachycardia 127bpm  ASSESSMENT AND PLAN:  Echo findings: EF 36%, moderate-severe MR, TR. Severe LV dysfunction and severe pulmonary hypertension.As discussed with Dr. Enedina FinnerSona Patel by Dr. Welton FlakesKhan: Stop IV amiodarone and change to PO dosing amiodarone 400mg  BID. On cardizem 60mg  three times daily for rate control.   Consider changing Cardizem to beta blocker due to severe LV dysfunction and adding Entreso 24-26mg  if blood pressure is stable.      Principal Problem:   Acute on chronic diastolic CHF (congestive heart failure) (HCC) Active Problems:   Atrial fibrillation with RVR (HCC)   HTN (hypertension)   HLD (hyperlipidemia)   Diabetes (HCC)   AKI (acute kidney injury) (HCC)   Pressure injury of skin    Faith HammanKristin Maraya Gwilliam, NP-C 06/18/2017 8:44 AM

## 2017-06-19 LAB — GLUCOSE, CAPILLARY
Glucose-Capillary: 89 mg/dL (ref 65–99)
Glucose-Capillary: 92 mg/dL (ref 65–99)
Glucose-Capillary: 101 mg/dL — ABNORMAL HIGH (ref 65–99)

## 2017-06-19 MED ORDER — CARVEDILOL 6.25 MG PO TABS: 6.2500 mg | ORAL_TABLET | Freq: Two times a day (BID) | ORAL | 0 refills | Status: AC

## 2017-06-19 MED ORDER — DIGOXIN 125 MCG PO TABS: 0.1250 mg | ORAL_TABLET | Freq: Every day | ORAL | 1 refills | Status: AC

## 2017-06-19 MED ORDER — TORSEMIDE 20 MG PO TABS: 20.0000 mg | ORAL_TABLET | Freq: Every day | ORAL | Status: DC

## 2017-06-19 MED ORDER — AMIODARONE HCL 200 MG PO TABS: 400.0000 mg | ORAL_TABLET | Freq: Two times a day (BID) | ORAL | 0 refills | Status: AC

## 2017-06-19 MED ORDER — CARVEDILOL 6.25 MG PO TABS: 6.2500 mg | ORAL_TABLET | Freq: Two times a day (BID) | ORAL | Status: DC

## 2017-06-19 NOTE — NC FL2 (Signed)
[place] MEDICAID FL2 LEVEL OF CARE SCREENING TOOL     IDENTIFICATION  Patient Name: Faith Brown Birthdate: Jul 14, 1925 Sex: female Admission Date (Current Location): 06/16/2017  [place]   Facility and Address:  [facility], [address], [place], [place] [address]      Provider Number: [id number]  Attending Physician Name and Address:  Enedina FinnerPatel, Sona, MD  Relative Name and Phone Number:  Guadlupe SpanishMorrow,Lawrence C Son [phone]  [phone]     Current Level of Care: Hospital Recommended Level of Care: Skilled Nursing Facility Prior Approval Number:    Date Approved/Denied:   PASRR Number: [id number]  Discharge Plan: SNF    Current Diagnoses: Patient Active Problem List   Diagnosis Date Noted  . Pressure injury of skin 06/17/2017  . Atrial fibrillation with RVR (HCC) 06/16/2017  . HTN (hypertension) 06/16/2017  . HLD (hyperlipidemia) 06/16/2017  . Diabetes (HCC) 06/16/2017  . AKI (acute kidney injury) (HCC) 06/16/2017  . Acute on chronic diastolic CHF (congestive heart failure) (HCC) 09/01/2015    Orientation RESPIRATION BLADDER Height & Weight     Self  O2(2L) Incontinent Weight: 201 lb 14.4 oz (91.6 kg) Height:  5\' 6"  (167.6 cm)  BEHAVIORAL SYMPTOMS/MOOD NEUROLOGICAL BOWEL NUTRITION STATUS      Incontinent Diet(Cardiac)  AMBULATORY STATUS COMMUNICATION OF NEEDS Skin   Total Care Verbally PU Stage and Appropriate Care     PU Stage 3 Dressing: (PRN dressing change)                 Personal Care Assistance Level of Assistance  Bathing, Feeding, Dressing Bathing Assistance: Maximum assistance Feeding assistance: Maximum assistance Dressing Assistance: Maximum assistance     Functional Limitations Info  Sight, Hearing, Speech Sight Info: Adequate Hearing Info: Adequate Speech Info: Adequate    SPECIAL CARE FACTORS FREQUENCY                       Contractures      Additional Factors  Info  Insulin Sliding Scale, Code Status, Allergies Code Status Info: Full Allergies Info: LANOLIN    Insulin Sliding Scale Info: insulin aspart (novoLOG) injection 0-9 Units 3x a day with meals       Current Medications (06/19/2017):  This is the current hospital active medication list Current Facility-Administered Medications  Medication Dose Route Frequency Provider Last Rate Last Dose  . acetaminophen (TYLENOL) tablet 650 mg  650 mg Oral Q6H PRN Oralia ManisWillis, David, MD   650 mg at 06/17/17 0119   Or  . acetaminophen (TYLENOL) suppository 650 mg  650 mg Rectal Q6H PRN Oralia ManisWillis, David, MD      . amiodarone (PACERONE) tablet 400 mg  400 mg Oral BID Enedina FinnerPatel, Sona, MD   400 mg at 06/19/17 [id number]  . aspirin chewable tablet 81 mg  81 mg Oral Daily Oralia ManisWillis, David, MD   81 mg at 06/19/17 [id number]  . atorvastatin (LIPITOR) tablet 20 mg  20 mg Oral Daily Oralia ManisWillis, David, MD   20 mg at 06/19/17 0937  . carvedilol (COREG) tablet 6.25 mg  6.25 mg Oral BID WC Enedina FinnerPatel, Sona, MD      . digoxin Margit Banda(LANOXIN) tablet 0.125 mg  0.125 mg Oral Daily Enedina FinnerPatel, Sona, MD   0.125 mg at 06/19/17 0938  . heparin injection 5,000 Units  5,000 Units Subcutaneous Willow OraQ8H Willis, David, MD   5,000 Units at 06/19/17 0602  . insulin aspart (novoLOG) injection 0-5 Units  0-5 Units Subcutaneous QHS  Oralia ManisWillis, David, MD      . insulin aspart (novoLOG) injection 0-9 Units  0-9 Units Subcutaneous TID WC Oralia ManisWillis, David, MD      . ipratropium-albuterol (DUONEB) 0.5-2.5 (3) MG/3ML nebulizer solution 3 mL  3 mL Nebulization Q4H PRN Oralia ManisWillis, David, MD      . ondansetron [facility](ZOFRAN) tablet 4 mg  4 mg Oral Q6H PRN Oralia ManisWillis, David, MD       Or  . ondansetron [facility](ZOFRAN) injection 4 mg  4 mg Intravenous Q6H PRN Oralia ManisWillis, David, MD      . sodium chloride flush (NS) 0.9 % injection 3 mL  3 mL Intravenous Q12H Enedina FinnerPatel, Sona, MD   3 mL at 06/19/17 0941  . [START ON 06/20/2017] torsemide (DEMADEX) tablet 20 mg  20 mg Oral Daily Enedina FinnerPatel, Sona, MD         Discharge Medications: Please see  discharge summary for a list of discharge medications.  Relevant Imaging Results:  Relevant Lab Results:   Additional Information SSN [id number]  Darleene Cleavernterhaus, Zayonna Ayuso R, [place]

## 2017-06-19 NOTE — Clinical Social Work Note (Addendum)
Patient to be d/c'ed today to [facility].  Patient and family agreeable to plans will transport via ems RN to call report [phone].  CSW called patient's son Lyman BishopLawrence and left message on voice mail at [phone].  Windell MouldingEric Adrien Dietzman, MSW, Theresia MajorsLCSWA [phone]

## 2017-06-19 NOTE — Progress Notes (Addendum)
Faith JewelLillian Brown to be D/C'd Skilled nursing facility [facility]( [place]  [facility]) per MD order. [facility]. Discussed prescriptions and follow up appointments with the patient. Prescriptions were e-prescribed, medication list explained in detail. Pt verbalized understanding.  Allergies as of 06/19/2017      Reactions   Lanolin Rash      Medication List    STOP taking these medications   diltiazem 240 MG 24 hr capsule Commonly known as:  CARDIZEM CD   enalapril 2.5 MG tablet Commonly known as:  VASOTEC     TAKE these medications   amiodarone 200 MG tablet Commonly known as:  PACERONE Take 2 tablets (400 mg total) 2 (two) times daily by mouth. After 1 week start 200 mg bid   aspirin 81 MG chewable tablet Commonly known as:  ASPIRIN CHILDRENS Chew 1 tablet (81 mg total) by mouth daily.   atorvastatin 20 MG tablet Commonly known as:  LIPITOR Take 1 tablet (20 mg total) by mouth daily.   carvedilol 6.25 MG tablet Commonly known as:  COREG Take 1 tablet (6.25 mg total) 2 (two) times daily with a meal by mouth.   digoxin 0.125 MG tablet Commonly known as:  LANOXIN Take 1 tablet (0.125 mg total) daily by mouth. Start taking on:  06/20/2017   nitroGLYCERIN 0.4 MG SL tablet Commonly known as:  NITROSTAT Place 1 tablet (0.4 mg total) under the tongue every 5 (five) minutes as needed for chest pain.   torsemide 20 MG tablet Commonly known as:  DEMADEX Take 1 tablet (20 mg total) by mouth daily. What changed:  when to take this       Vitals:   06/19/17 1200 06/19/17 1201  BP: 112/75 112/75  Pulse:  (!) 125  Resp:  18  Temp:    SpO2:  97%    Skin clean, dry and intact without evidence of skin break down, no evidence of skin tears noted. IV catheter discontinued intact. Site without signs and symptoms of complications. Dressing and pressure applied. Tele box removed and returned. [facility] called for transport. Pt denies pain at this time. No  complaints noted.    Rigoberto NoelErica Y Hazim Treadway

## 2017-06-19 NOTE — Progress Notes (Addendum)
SUBJECTIVE: Feeling tired. Mild shortness of breath, has some chest wall tenderness.    Vitals:   06/18/17 1535 06/18/17 1803 06/18/17 1939 06/19/17 0422  BP: 102/70 115/77 112/73 99/61  Pulse: (!) 123 (!) 123 (!) 122 83  Resp:  20    Temp:  98.1 F (36.7 C) 98.2 F (36.8 C) 97.6 F (36.4 C)  TempSrc:  Oral Oral Oral  SpO2: 92% 94% 96% 100%  Weight:    201 lb 14.4 oz (91.6 kg)  Height:        Intake/Output Summary (Last 24 hours) at 06/19/2017 0839 Last data filed at 06/19/2017 [id number] Gross per 24 hour  Intake 369 ml  Output 3400 ml  Net -3031 ml    LABS: Basic Metabolic Panel: Recent Labs    06/16/17 2017 06/17/17 0811  NA 140 141  K 4.3 3.8  CL 102 103  CO2 31 33*  GLUCOSE 95 94  BUN 39* 33*  CREATININE 1.35* 0.88  CALCIUM 8.2* 8.2*   Liver Function Tests: Recent Labs    06/16/17 2017  AST 27  ALT 26  ALKPHOS 68  BILITOT 0.6  PROT 7.5  ALBUMIN 2.9*   No results for input(s): LIPASE, AMYLASE in the last 72 hours. CBC: Recent Labs    06/16/17 2017 06/17/17 0811  WBC 4.7 4.0  NEUTROABS 3.2  --   HGB 9.0* 8.3*  HCT 27.9* 26.1*  MCV 93.8 94.3  PLT 243 206   Cardiac Enzymes: Recent Labs    06/16/17 2017 06/17/17 0223 06/17/17 0811  TROPONINI 0.04* 0.04* 0.04*   BNP: Invalid input(s): POCBNP D-Dimer: No results for input(s): DDIMER in the last 72 hours. Hemoglobin A1C: No results for input(s): HGBA1C in the last 72 hours. Fasting Lipid Panel: No results for input(s): CHOL, HDL, LDLCALC, TRIG, CHOLHDL, LDLDIRECT in the last 72 hours. Thyroid Function Tests: No results for input(s): TSH, T4TOTAL, T3FREE, THYROIDAB in the last 72 hours.  Invalid input(s): FREET3 Anemia Panel: No results for input(s): VITAMINB12, FOLATE, FERRITIN, TIBC, IRON, RETICCTPCT in the last 72 hours.   PHYSICAL EXAM General: Well developed, well nourished, in no acute distress HEENT:  Normocephalic and atramatic Neck:  No JVD.  Lungs: Wheezing bilaterally.  Reports inspiratory chest wall pain. Heart: HRRR . Normal S1 and S2 without gallops or murmurs.  Abdomen: Bowel sounds are positive, abdomen soft and non-tender  Msk:  Chest wall tenderness to palpation of sternal area. Extremities: Mild edema, boot on right leg. Neuro: Alert and oriented X 3. Psych:  Good affect, responds appropriately  TELEMETRY: Sinus tachycardia 121bpm  ASSESSMENT AND PLAN: CHF exacerbation with history of Afib RVR, now in sinus tachycardia. EF is 35% on echo. Continue digoxin and amiodarone. Advise stopping Cardizem due to negative inotropic effects and start low dose carvedilol.May consider Entresto if blood pressure is stable.   History of paroxysmal atrial fibrillation. ChadsVasc score of 6, however pt is anemic and her advanced age puts her at high risk for bleeding. No anticoagulation therapy at this time.  Advise outpatient cardiology follow up with Dr. Adrian BlackwaterShaukat Khan, Monday 06/24/17 at 10am.   Principal Problem:   Acute on chronic diastolic CHF (congestive heart failure) (HCC) Active Problems:   Atrial fibrillation with RVR (HCC)   HTN (hypertension)   HLD (hyperlipidemia)   Diabetes (HCC)   AKI (acute kidney injury) (HCC)   Pressure injury of skin    Caroleen HammanKristin Aylene Acoff, NP-C 06/19/2017 8:39 AM

## 2017-06-19 NOTE — Discharge Summary (Signed)
[facility] [place] [facility]   PATIENT NAME: Faith Brown    MR#:  [id number]  DATE OF BIRTH:  1925-05-12  DATE OF ADMISSION:  06/16/2017 ADMITTING PHYSICIAN: Oralia Manisavid Willis, MD  DATE OF DISCHARGE: 06/19/2017  PRIMARY CARE PHYSICIAN: Patient, No Pcp Per    ADMISSION DIAGNOSIS:  SOB (shortness of breath) [R06.02] Hypoxia [R09.02] Acute on chronic congestive heart failure, unspecified heart failure type (HCC) [I50.9]  DISCHARGE DIAGNOSIS:  Acute on Chronic CHF,systolic Acute on Chronic Afib Dm-2 Relative hypotension  SECONDARY DIAGNOSIS:   Past Medical History:  Diagnosis Date  . Atrial fibrillation (HCC)   . CHF (congestive heart failure) (HCC)   . Diabetes mellitus without complication (HCC)   . Hyperlipemia   . Hypertension   . Shortness of breath dyspnea     HOSPITAL COURSE:   Faith Brown a83 y.o.femalewho presents with shortness of breath requiring oxygen in the ED. She states that her shortness of breath has been building up over the past several days. Here in the ED her chest x-ray shows pulmonary edema. She was also in A. fib with RVR  * Acute on chronic systolic  CHF (congestive heart failure) (HCC)  --IV diuresis lasix now changed to po torsemide - echocardiogram showed EF of 35% - cardiology consult with Dr Welton Flakeskhan.  Recommends Coreg 3.125 bid p.o. amiodarone 400 milligrams twice daily -No eliquis (d/w [place])   *Atrial fibrillation with RVR (HCC) --iV AMIODARONE gtt---p.o. amiodarone 400 mg twice daily x 7 days then 200 mg  Bid -We will add IV digoxin load and start patient with 0.5 mg once and 0.125 mg p.o. Today -f/u Dr Welton Flakeskhan as out pt -cont asa  * AKI (acute kidney injury) (HCC) -unable to give IV fluids due to the fact that we are actually having to diurese her, suspect this may be some cardiorenal effect given her A. fib and heart failure, treat as above and monitor for improvement -creat back to  baseline of 0.88  *HTN (hypertension) -continue home meds  *Diabetes (HCC) -sliding scale insulin with corresponding glucose checks  *HLD (hyperlipidemia) -continue home meds  Overall [facility] agreeable D/w csw  CONSULTS OBTAINED:  Treatment Team:  Laurier NancyKhan, Shaukat A, MD  DRUG ALLERGIES:   Allergies  Allergen Reactions  . Lanolin Rash    DISCHARGE MEDICATIONS:   Current Discharge Medication List    START taking these medications   Details  amiodarone (PACERONE) 200 MG tablet Take 2 tablets (400 mg total) 2 (two) times daily by mouth. After 1 week start 200 mg bid Qty: 60 tablet, Refills: 0    carvedilol (COREG) 6.25 MG tablet Take 1 tablet (6.25 mg total) 2 (two) times daily with a meal by mouth. Qty: 60 tablet, Refills: 0    digoxin (LANOXIN) 0.125 MG tablet Take 1 tablet (0.125 mg total) daily by mouth. Qty: 30 tablet, Refills: 1      CONTINUE these medications which have NOT CHANGED   Details  aspirin (ASPIRIN CHILDRENS) 81 MG chewable tablet Chew 1 tablet (81 mg total) by mouth daily. Qty: 120 tablet, Refills: 0    atorvastatin (LIPITOR) 20 MG tablet Take 1 tablet (20 mg total) by mouth daily. Qty: 30 tablet, Refills: 0    nitroGLYCERIN (NITROSTAT) 0.4 MG SL tablet Place 1 tablet (0.4 mg total) under the tongue every 5 (five) minutes as needed for chest pain. Qty: 30 tablet, Refills: 0    torsemide (DEMADEX) 20 MG tablet  Take 1 tablet (20 mg total) by mouth daily. Qty: 30 tablet, Refills: 0      STOP taking these medications     diltiazem (CARDIZEM CD) 240 MG 24 hr capsule      enalapril (VASOTEC) 2.5 MG tablet         If you experience worsening of your admission symptoms, develop shortness of breath, life threatening emergency, suicidal or homicidal thoughts you must seek medical attention immediately by calling 911 or calling your MD immediately  if symptoms less severe.  You Must read complete  instructions/literature along with all the possible adverse reactions/side effects for all the Medicines you take and that have been prescribed to you. Take any new Medicines after you have completely understood and accept all the possible adverse reactions/side effects.   Please note  You were cared for by a hospitalist during your hospital stay. If you have any questions about your discharge medications or the care you received while you were in the hospital after you are discharged, you can call the unit and asked to speak with the hospitalist on call if the hospitalist that took care of you is not available. Once you are discharged, your primary care physician will handle any further medical issues. Please note that NO REFILLS for any discharge medications will be authorized once you are discharged, as it is imperative that you return to your primary care physician (or establish a relationship with a primary care physician if you do not have one) for your aftercare needs so that they can reassess your need for medications and monitor your lab values. Today   SUBJECTIVE    I feel weak VITAL SIGNS:  Blood pressure 112/75, pulse (!) 125, temperature 97.8 F (36.6 C), temperature source Oral, resp. rate 18, height 5\' 6"  (1.676 m), weight 91.6 kg (201 lb 14.4 oz), SpO2 97 %.  I/O:    Intake/Output Summary (Last 24 hours) at 06/19/2017 1213 Last data filed at 06/19/2017 1041 Gross per 24 hour  Intake 369 ml  Output 3400 ml  Net -3031 ml    PHYSICAL EXAMINATION:  GENERAL:  83 y.o.-year-old patient lying in the bed with no acute distress.  EYES: Pupils equal, round, reactive to light and accommodation. No scleral icterus. Extraocular muscles intact.  HEENT: Head atraumatic, normocephalic. Oropharynx and nasopharynx clear.  NECK:  Supple, no jugular venous distention. No thyroid enlargement, no tenderness.  LUNGS: Normal breath sounds bilaterally, no wheezing, rales,rhonchi or crepitation. No  use of accessory muscles of respiration.  CARDIOVASCULAR: S1, S2 normal. No murmurs, rubs, or gallops. irregualr rhythm ABDOMEN: Soft, non-tender, non-distended. Bowel sounds present. No organomegaly or mass.  EXTREMITIES: No pedal edema, cyanosis, or clubbing. Right heel proctector NEUROLOGIC: Cranial nerves II through XII are intact. Muscle strength 5/5 in all extremities. Sensation intact. Gait not checked.  PSYCHIATRIC: The patient is alert and oriented x 3.  SKIN: No obvious rash, lesion, or ulcer.   DATA REVIEW:   CBC  Recent Labs  Lab 06/17/17 0811  WBC 4.0  HGB 8.3*  HCT 26.1*  PLT 206    Chemistries  Recent Labs  Lab 06/16/17 2017 06/17/17 0811  NA 140 141  K 4.3 3.8  CL 102 103  CO2 31 33*  GLUCOSE 95 94  BUN 39* 33*  CREATININE 1.35* 0.88  CALCIUM 8.2* 8.2*  AST 27  --   ALT 26  --   ALKPHOS 68  --   BILITOT 0.6  --  Microbiology Results   Recent Results (from the past 240 hour(s))  Culture, blood (routine x 2)     Status: None (Preliminary result)   Collection Time: 06/16/17  8:17 PM  Result Value Ref Range Status   Specimen Description BLOOD LEFT FOREARM  Final   Special Requests   Final    BOTTLES DRAWN AEROBIC AND ANAEROBIC Blood Culture adequate volume   Culture NO GROWTH 3 DAYS  Final   Report Status PENDING  Incomplete  Culture, blood (routine x 2)     Status: None (Preliminary result)   Collection Time: 06/16/17  8:17 PM  Result Value Ref Range Status   Specimen Description BLOOD RIGHT ANTECUBITAL  Final   Special Requests   Final    BOTTLES DRAWN AEROBIC AND ANAEROBIC Blood Culture adequate volume   Culture NO GROWTH 3 DAYS  Final   Report Status PENDING  Incomplete  MRSA PCR Screening     Status: None   Collection Time: 06/17/17 12:59 AM  Result Value Ref Range Status   MRSA by PCR NEGATIVE NEGATIVE Final    Comment:        The GeneXpert MRSA Assay (FDA approved for NASAL specimens only), is one component of a comprehensive  MRSA colonization surveillance program. It is not intended to diagnose MRSA infection nor to guide or monitor treatment for MRSA infections.     RADIOLOGY:  No results found.   Management plans discussed with the patient, family and they are in agreement.  CODE STATUS:     Code Status Orders  (From admission, onward)        Start     Ordered   06/17/17 0054  Full code  Continuous     06/17/17 0054    Code Status History    Date Active Date Inactive Code Status Order ID Comments User Context   09/01/2015 15:49 09/05/2015 18:48 Full Code [id number]  Gale Journey, MD Inpatient      TOTAL TIME TAKING CARE OF THIS PATIENT: 40* minutes.    Gilverto Dileonardo M.D on 06/19/2017 at 12:13 PM  Between 7am to 6pm - Pager - [phone] After 6pm go to [email or web address] - password [email or web address]  [facility]  Office  [phone]  CC: Primary care physician; Patient, No Pcp Per

## 2017-06-19 NOTE — Care Management Important Message (Signed)
Important Message  Patient Details  Name: Faith JewelLillian Brown MRN: [id number] Date of Birth: 18-Mar-1925   Medicare Important Message Given:  Yes Signed IM notice given    [name], [name], RN 06/19/2017, 2:55 PM

## 2017-06-19 NOTE — Progress Notes (Signed)
Pt due for Cardizem 60 mg, Enalapril 2.5 mg. BP 109/75, pulse 126, per Dr. Allena KatzPatel okay to give cardizem if pt asymptomatic and hold enalapril. Will continue to monitor.

## 2017-06-21 LAB — CULTURE, BLOOD (ROUTINE X 2)
CULTURE: NO GROWTH
Culture: NO GROWTH
SPECIAL REQUESTS: ADEQUATE
SPECIAL REQUESTS: ADEQUATE

## 2017-06-27 ENCOUNTER — Ambulatory Visit: Payer: Medicare Other | Admitting: Family

## 2017-10-20 ENCOUNTER — Emergency Department: Payer: Medicare Other

## 2017-10-20 ENCOUNTER — Encounter: Payer: Self-pay | Admitting: Emergency Medicine

## 2017-10-20 ENCOUNTER — Other Ambulatory Visit: Payer: Self-pay

## 2017-10-20 ENCOUNTER — Inpatient Hospital Stay
Admission: EM | Admit: 2017-10-20 | Discharge: 2017-11-11 | DRG: 871 | Disposition: E | Payer: Medicare Other | Attending: Internal Medicine | Admitting: Internal Medicine

## 2017-10-20 DIAGNOSIS — R571 Hypovolemic shock: Secondary | ICD-10-CM | POA: Diagnosis present

## 2017-10-20 DIAGNOSIS — I5032 Chronic diastolic (congestive) heart failure: Secondary | ICD-10-CM | POA: Diagnosis present

## 2017-10-20 DIAGNOSIS — I469 Cardiac arrest, cause unspecified: Secondary | ICD-10-CM | POA: Diagnosis not present

## 2017-10-20 DIAGNOSIS — Z7902 Long term (current) use of antithrombotics/antiplatelets: Secondary | ICD-10-CM

## 2017-10-20 DIAGNOSIS — E785 Hyperlipidemia, unspecified: Secondary | ICD-10-CM | POA: Diagnosis present

## 2017-10-20 DIAGNOSIS — D62 Acute posthemorrhagic anemia: Secondary | ICD-10-CM | POA: Diagnosis present

## 2017-10-20 DIAGNOSIS — R578 Other shock: Secondary | ICD-10-CM

## 2017-10-20 DIAGNOSIS — E876 Hypokalemia: Secondary | ICD-10-CM | POA: Diagnosis present

## 2017-10-20 DIAGNOSIS — I4891 Unspecified atrial fibrillation: Secondary | ICD-10-CM | POA: Diagnosis present

## 2017-10-20 DIAGNOSIS — G9341 Metabolic encephalopathy: Secondary | ICD-10-CM | POA: Diagnosis present

## 2017-10-20 DIAGNOSIS — Z79899 Other long term (current) drug therapy: Secondary | ICD-10-CM

## 2017-10-20 DIAGNOSIS — R4182 Altered mental status, unspecified: Secondary | ICD-10-CM | POA: Diagnosis present

## 2017-10-20 DIAGNOSIS — J9601 Acute respiratory failure with hypoxia: Secondary | ICD-10-CM | POA: Diagnosis present

## 2017-10-20 DIAGNOSIS — E872 Acidosis: Secondary | ICD-10-CM | POA: Diagnosis present

## 2017-10-20 DIAGNOSIS — Z95828 Presence of other vascular implants and grafts: Secondary | ICD-10-CM

## 2017-10-20 DIAGNOSIS — J9690 Respiratory failure, unspecified, unspecified whether with hypoxia or hypercapnia: Secondary | ICD-10-CM

## 2017-10-20 DIAGNOSIS — I350 Nonrheumatic aortic (valve) stenosis: Secondary | ICD-10-CM | POA: Diagnosis not present

## 2017-10-20 DIAGNOSIS — E1129 Type 2 diabetes mellitus with other diabetic kidney complication: Secondary | ICD-10-CM | POA: Diagnosis present

## 2017-10-20 DIAGNOSIS — I5033 Acute on chronic diastolic (congestive) heart failure: Secondary | ICD-10-CM

## 2017-10-20 DIAGNOSIS — Z4659 Encounter for fitting and adjustment of other gastrointestinal appliance and device: Secondary | ICD-10-CM

## 2017-10-20 DIAGNOSIS — G9340 Encephalopathy, unspecified: Secondary | ICD-10-CM | POA: Diagnosis present

## 2017-10-20 DIAGNOSIS — Z515 Encounter for palliative care: Secondary | ICD-10-CM | POA: Diagnosis present

## 2017-10-20 DIAGNOSIS — I110 Hypertensive heart disease with heart failure: Secondary | ICD-10-CM | POA: Diagnosis present

## 2017-10-20 DIAGNOSIS — R6521 Severe sepsis with septic shock: Secondary | ICD-10-CM | POA: Diagnosis present

## 2017-10-20 DIAGNOSIS — Z90710 Acquired absence of both cervix and uterus: Secondary | ICD-10-CM

## 2017-10-20 DIAGNOSIS — I2510 Atherosclerotic heart disease of native coronary artery without angina pectoris: Secondary | ICD-10-CM | POA: Diagnosis present

## 2017-10-20 DIAGNOSIS — A419 Sepsis, unspecified organism: Secondary | ICD-10-CM | POA: Diagnosis present

## 2017-10-20 DIAGNOSIS — K625 Hemorrhage of anus and rectum: Secondary | ICD-10-CM | POA: Diagnosis not present

## 2017-10-20 DIAGNOSIS — J690 Pneumonitis due to inhalation of food and vomit: Secondary | ICD-10-CM | POA: Diagnosis present

## 2017-10-20 DIAGNOSIS — N179 Acute kidney failure, unspecified: Secondary | ICD-10-CM | POA: Diagnosis present

## 2017-10-20 DIAGNOSIS — Z66 Do not resuscitate: Secondary | ICD-10-CM | POA: Diagnosis not present

## 2017-10-20 DIAGNOSIS — Z7982 Long term (current) use of aspirin: Secondary | ICD-10-CM

## 2017-10-20 DIAGNOSIS — Z951 Presence of aortocoronary bypass graft: Secondary | ICD-10-CM | POA: Diagnosis not present

## 2017-10-20 DIAGNOSIS — K922 Gastrointestinal hemorrhage, unspecified: Secondary | ICD-10-CM | POA: Diagnosis not present

## 2017-10-20 DIAGNOSIS — Z91048 Other nonmedicinal substance allergy status: Secondary | ICD-10-CM | POA: Diagnosis not present

## 2017-10-20 LAB — CBC WITH DIFFERENTIAL/PLATELET
BAND NEUTROPHILS: 0 %
BASOS ABS: 0 10*3/uL (ref 0–0.1)
BLASTS: 0 %
Basophils Relative: 0 %
EOS ABS: 0 10*3/uL (ref 0–0.7)
Eosinophils Relative: 1 %
HEMATOCRIT: 9.6 % — AB (ref 35.0–47.0)
HEMOGLOBIN: 2.8 g/dL — AB (ref 12.0–16.0)
LYMPHS PCT: 29 %
Lymphs Abs: 1.1 10*3/uL (ref 1.0–3.6)
MCH: 24.8 pg — ABNORMAL LOW (ref 26.0–34.0)
MCHC: 29.2 g/dL — AB (ref 32.0–36.0)
MCV: 84.9 fL (ref 80.0–100.0)
MONO ABS: 0.1 10*3/uL — AB (ref 0.2–0.9)
MYELOCYTES: 0 %
Metamyelocytes Relative: 0 %
Monocytes Relative: 2 %
Neutro Abs: 2.6 10*3/uL (ref 1.4–6.5)
Neutrophils Relative %: 68 %
OTHER: 0 %
PROMYELOCYTES ABS: 0 %
Platelets: 170 10*3/uL (ref 150–440)
RBC: 1.14 MIL/uL — ABNORMAL LOW (ref 3.80–5.20)
RDW: 20 % — ABNORMAL HIGH (ref 11.5–14.5)
WBC: 3.8 10*3/uL (ref 3.6–11.0)
nRBC: 4 /100 WBC — ABNORMAL HIGH

## 2017-10-20 LAB — BLOOD GAS, ARTERIAL
Acid-base deficit: 9.4 mmol/L — ABNORMAL HIGH (ref 0.0–2.0)
Bicarbonate: 16.4 mmol/L — ABNORMAL LOW (ref 20.0–28.0)
FIO2: 100
MECHANICAL RATE: 18
O2 SAT: 100 %
PCO2 ART: 35 mmHg (ref 32.0–48.0)
PEEP: 5 cmH2O
PO2 ART: 404 mmHg — AB (ref 83.0–108.0)
Patient temperature: 37
VT: 500 mL
pH, Arterial: 7.28 — ABNORMAL LOW (ref 7.350–7.450)

## 2017-10-20 LAB — TROPONIN I
TROPONIN I: 0.03 ng/mL — AB (ref <0.03)
Troponin I: <0.03 ng/mL (ref <0.03)

## 2017-10-20 LAB — BLOOD GAS, VENOUS
ACID-BASE DEFICIT: 6.6 mmol/L — AB (ref 0.0–2.0)
BICARBONATE: 21 mmol/L (ref 20.0–28.0)
O2 SAT: 46.9 %
Patient temperature: 37
pCO2, Ven: 49 mmHg (ref 44.0–60.0)
pH, Ven: 7.24 — ABNORMAL LOW (ref 7.250–7.430)

## 2017-10-20 LAB — COMPREHENSIVE METABOLIC PANEL
ALBUMIN: 2.1 g/dL — AB (ref 3.5–5.0)
ALK PHOS: 32 U/L — AB (ref 38–126)
ALT: 11 U/L — AB (ref 14–54)
AST: 26 U/L (ref 15–41)
Anion gap: 12 (ref 5–15)
BUN: 57 mg/dL — AB (ref 6–20)
CALCIUM: 7.4 mg/dL — AB (ref 8.9–10.3)
CO2: 21 mmol/L — AB (ref 22–32)
CREATININE: 1.68 mg/dL — AB (ref 0.44–1.00)
Chloride: 110 mmol/L (ref 101–111)
GFR calc Af Amer: 30 mL/min — ABNORMAL LOW (ref >60)
GFR calc non Af Amer: 26 mL/min — ABNORMAL LOW (ref >60)
GLUCOSE: 179 mg/dL — AB (ref 65–99)
Potassium: 5.3 mmol/L — ABNORMAL HIGH (ref 3.5–5.1)
Sodium: 143 mmol/L (ref 135–145)
TOTAL PROTEIN: 4.9 g/dL — AB (ref 6.5–8.1)
Total Bilirubin: 0.8 mg/dL (ref 0.3–1.2)

## 2017-10-20 LAB — GLUCOSE, CAPILLARY: Glucose-Capillary: 214 mg/dL — ABNORMAL HIGH (ref 65–99)

## 2017-10-20 LAB — ABO/RH: ABO/RH(D): O POS

## 2017-10-20 LAB — PREPARE RBC (CROSSMATCH)

## 2017-10-20 LAB — LACTIC ACID, PLASMA
LACTIC ACID, VENOUS: 6.7 mmol/L — AB (ref 0.5–1.9)
Lactic Acid, Venous: 9.3 mmol/L (ref 0.5–1.9)

## 2017-10-20 MED ORDER — IPRATROPIUM-ALBUTEROL 0.5-2.5 (3) MG/3ML IN SOLN
3.0000 mL | Freq: Once | RESPIRATORY_TRACT | Status: AC
  Administered 2017-10-20: 3 mL via RESPIRATORY_TRACT

## 2017-10-20 MED ORDER — PANTOPRAZOLE SODIUM 40 MG IV SOLR: 40.0000 mg | Freq: Two times a day (BID) | INTRAVENOUS | Status: DC

## 2017-10-20 MED ORDER — SODIUM CHLORIDE 0.9 % IV SOLN
INTRAVENOUS | Status: DC
  Administered 2017-10-20 – 2017-10-21 (×2): via INTRAVENOUS

## 2017-10-20 MED ORDER — IPRATROPIUM-ALBUTEROL 0.5-2.5 (3) MG/3ML IN SOLN
RESPIRATORY_TRACT | Status: AC
  Administered 2017-10-20: 3 mL via RESPIRATORY_TRACT
  Filled 2017-10-20: qty 3

## 2017-10-20 MED ORDER — VANCOMYCIN HCL IN DEXTROSE 1-5 GM/200ML-% IV SOLN
1000.0000 mg | Freq: Once | INTRAVENOUS | Status: AC
  Administered 2017-10-20: 1000 mg via INTRAVENOUS

## 2017-10-20 MED ORDER — SENNOSIDES 8.8 MG/5ML PO SYRP: 5.0000 mL | ORAL_SOLUTION | Freq: Two times a day (BID) | ORAL | Status: DC | PRN

## 2017-10-20 MED ORDER — ETOMIDATE 2 MG/ML IV SOLN
20.0000 mg | Freq: Once | INTRAVENOUS | Status: AC
  Administered 2017-10-20: 20 mg via INTRAVENOUS

## 2017-10-20 MED ORDER — EPINEPHRINE PF 1 MG/10ML IJ SOSY
1.0000 | PREFILLED_SYRINGE | Freq: Once | INTRAMUSCULAR | Status: AC
  Administered 2017-10-20: 1 mg via INTRAVENOUS

## 2017-10-20 MED ORDER — AMPICILLIN-SULBACTAM SODIUM 3 (2-1) G IJ SOLR
INTRAMUSCULAR | Status: AC
Start: 2017-10-20 — End: 2017-10-20
  Administered 2017-10-20: 3 g via INTRAVENOUS
  Filled 2017-10-20: qty 3

## 2017-10-20 MED ORDER — ACETAMINOPHEN 650 MG RE SUPP
650.0000 mg | Freq: Four times a day (QID) | RECTAL | Status: DC | PRN
  Administered 2017-10-21: 650 mg via RECTAL
  Filled 2017-10-20: qty 1

## 2017-10-20 MED ORDER — ONDANSETRON HCL 4 MG/2ML IJ SOLN: 4.0000 mg | Freq: Four times a day (QID) | INTRAMUSCULAR | Status: DC | PRN

## 2017-10-20 MED ORDER — PROPOFOL 1000 MG/100ML IV EMUL
0.0000 ug/kg/min | INTRAVENOUS | Status: DC
  Administered 2017-10-20 – 2017-10-21 (×2): 30 ug/kg/min via INTRAVENOUS
  Administered 2017-10-21: 10 ug/kg/min via INTRAVENOUS
  Filled 2017-10-20 (×3): qty 100

## 2017-10-20 MED ORDER — PROPOFOL 10 MG/ML IV BOLUS
40.0000 mg | Freq: Once | INTRAVENOUS | Status: AC
  Administered 2017-10-20: 40 mg via INTRAVENOUS

## 2017-10-20 MED ORDER — IPRATROPIUM-ALBUTEROL 0.5-2.5 (3) MG/3ML IN SOLN
RESPIRATORY_TRACT | Status: AC
  Administered 2017-10-20: 3 mL via RESPIRATORY_TRACT
  Filled 2017-10-20: qty 9

## 2017-10-20 MED ORDER — ORAL CARE MOUTH RINSE: 15.0000 mL | Freq: Four times a day (QID) | OROMUCOSAL | Status: DC

## 2017-10-20 MED ORDER — SUCCINYLCHOLINE CHLORIDE 20 MG/ML IJ SOLN
100.0000 mg | Freq: Once | INTRAMUSCULAR | Status: AC
  Administered 2017-10-20: 100 mg via INTRAVENOUS

## 2017-10-20 MED ORDER — FENTANYL CITRATE (PF) 100 MCG/2ML IJ SOLN
50.0000 ug | Freq: Once | INTRAMUSCULAR | Status: AC
  Administered 2017-10-20: 50 ug via INTRAVENOUS

## 2017-10-20 MED ORDER — ONDANSETRON HCL 4 MG PO TABS: 4.0000 mg | ORAL_TABLET | Freq: Four times a day (QID) | ORAL | Status: DC | PRN

## 2017-10-20 MED ORDER — SODIUM CHLORIDE 0.9 % IV SOLN: 10.0000 mL/h | Freq: Once | INTRAVENOUS | Status: DC

## 2017-10-20 MED ORDER — INSULIN ASPART 100 UNIT/ML ~~LOC~~ SOLN: 0.0000 [IU] | Freq: Three times a day (TID) | SUBCUTANEOUS | Status: DC

## 2017-10-20 MED ORDER — FENTANYL CITRATE (PF) 100 MCG/2ML IJ SOLN: 50.0000 ug | INTRAMUSCULAR | Status: DC | PRN

## 2017-10-20 MED ORDER — NOREPINEPHRINE 4 MG/250ML-% IV SOLN
5.0000 ug/min | Freq: Once | INTRAVENOUS | Status: AC
  Administered 2017-10-20: 5 ug/min via INTRAVENOUS

## 2017-10-20 MED ORDER — SODIUM CHLORIDE 0.9 % IV SOLN
80.0000 mg | Freq: Once | INTRAVENOUS | Status: AC
  Administered 2017-10-20: 80 mg via INTRAVENOUS
  Filled 2017-10-20: qty 80

## 2017-10-20 MED ORDER — SODIUM CHLORIDE 0.9 % IV SOLN
8.0000 mg/h | INTRAVENOUS | Status: DC
  Administered 2017-10-20 – 2017-10-21 (×3): 8 mg/h via INTRAVENOUS
  Filled 2017-10-20 (×3): qty 80

## 2017-10-20 MED ORDER — PROPOFOL 1000 MG/100ML IV EMUL
INTRAVENOUS | Status: AC
  Administered 2017-10-20: 5 ug/kg/min via INTRAVENOUS
  Filled 2017-10-20: qty 100

## 2017-10-20 MED ORDER — FENTANYL CITRATE (PF) 100 MCG/2ML IJ SOLN
50.0000 ug | INTRAMUSCULAR | Status: DC | PRN
  Administered 2017-10-21 – 2017-10-22 (×3): 50 ug via INTRAVENOUS
  Filled 2017-10-20 (×3): qty 2

## 2017-10-20 MED ORDER — INSULIN ASPART 100 UNIT/ML ~~LOC~~ SOLN
0.0000 [IU] | SUBCUTANEOUS | Status: DC
  Administered 2017-10-21: 8 [IU] via SUBCUTANEOUS
  Administered 2017-10-21: 3 [IU] via SUBCUTANEOUS
  Filled 2017-10-20 (×2): qty 1

## 2017-10-20 MED ORDER — PROPOFOL 1000 MG/100ML IV EMUL
5.0000 ug/kg/min | Freq: Once | INTRAVENOUS | Status: AC
  Administered 2017-10-20: 5 ug/kg/min via INTRAVENOUS

## 2017-10-20 MED ORDER — ACETAMINOPHEN 325 MG PO TABS
650.0000 mg | ORAL_TABLET | Freq: Four times a day (QID) | ORAL | Status: DC | PRN
  Administered 2017-10-21: 650 mg via ORAL
  Filled 2017-10-20: qty 2

## 2017-10-20 MED ORDER — VASOPRESSIN 20 UNIT/ML IV SOLN
0.4000 [IU]/min | INTRAVENOUS | Status: DC
  Administered 2017-10-20 – 2017-10-21 (×4): 0.4 [IU]/min via INTRAVENOUS
  Filled 2017-10-20 (×7): qty 5

## 2017-10-20 MED ORDER — CHLORHEXIDINE GLUCONATE 0.12% ORAL RINSE (MEDLINE KIT)
15.0000 mL | Freq: Two times a day (BID) | OROMUCOSAL | Status: DC
  Administered 2017-10-21 (×2): 15 mL via OROMUCOSAL

## 2017-10-20 MED ORDER — VANCOMYCIN HCL IN DEXTROSE 1-5 GM/200ML-% IV SOLN
INTRAVENOUS | Status: AC
Start: 2017-10-20 — End: 2017-10-20
  Administered 2017-10-20: 1000 mg via INTRAVENOUS
  Filled 2017-10-20: qty 200

## 2017-10-20 MED ORDER — EPINEPHRINE PF 1 MG/ML IJ SOLN
0.5000 ug/min | INTRAMUSCULAR | Status: DC
Start: 2017-10-20 — End: 2017-10-22
  Administered 2017-10-20: 10 ug/min via INTRAVENOUS
  Administered 2017-10-20: 25 ug/min via INTRAVENOUS
  Administered 2017-10-21 (×2): 20 ug/min via INTRAVENOUS
  Administered 2017-10-21: 5 ug/min via INTRAVENOUS
  Administered 2017-10-21: 20 ug/min via INTRAVENOUS
  Filled 2017-10-20 (×7): qty 4

## 2017-10-20 MED ORDER — SODIUM CHLORIDE 0.9 % IV SOLN
3.0000 g | Freq: Once | INTRAVENOUS | Status: AC
  Administered 2017-10-20: 3 g via INTRAVENOUS

## 2017-10-20 MED ORDER — BISACODYL 10 MG RE SUPP: 10.0000 mg | Freq: Every day | RECTAL | Status: DC | PRN

## 2017-10-20 MED ORDER — IPRATROPIUM-ALBUTEROL 0.5-2.5 (3) MG/3ML IN SOLN
3.0000 mL | Freq: Four times a day (QID) | RESPIRATORY_TRACT | Status: DC
  Administered 2017-10-20 – 2017-10-21 (×5): 3 mL via RESPIRATORY_TRACT
  Filled 2017-10-20 (×4): qty 3

## 2017-10-20 NOTE — Consult Note (Addendum)
PULMONARY / CRITICAL CARE MEDICINE   Name: Faith Brown MRN: [id number] DOB: Jan 05, 1925    ADMISSION DATE:  11/09/2017   CONSULTATION DATE:  10/21/2017  REFERRING MD:  Dr. Allena KatzPatel  REASON: Acute GIB and acute blood loss anemia  HISTORY OF PRESENT ILLNESS:   This is a 84 year old African-American female, SNF resident, medical history as indicated below, who presented to the ED with acute change in mental status.  History is obtained from ED records as patient is currently intubated and sedated.  EMS was called by the nursing home staff because patient was minimally responsive.  At baseline, patient has some motor and verbal activity.  Today, nursing staff noted that she was not communicating at her baseline and her tongue and face with pale.  When EMS arrived, patient was hypoxic.  Upon arrival in the ED, patient was placed on BiPAP but subsequently failed BiPAP and was intubated.  Her ED labs showed a hemoglobin of 2.8, hematocrit of 9.6, platelets of 170, her potassium was 5.3, creatinine 1.68 up from her baseline of 2.88, calcium of 7.4 and a lactic acid of 9.38.  She is being admitted to the ICU for further management Once an NG was inserted, patient had coffee-ground output.  GI was consulted and she was started on a Protonix infusion.  Post intubation, patient developed hypovolemic shock necessitating pressors.  She is currently on epinephrine and vasopressin. Patient has a history of atrial fibrillation and CAD and was on digoxin, Plavix and aspirin  PAST MEDICAL HISTORY :  She  has a past medical history of Atrial fibrillation (HCC), CHF (congestive heart failure) (HCC), Diabetes mellitus without complication (HCC), Hyperlipemia, Hypertension, and Shortness of breath dyspnea.  PAST SURGICAL HISTORY: She  has a past surgical history that includes Abdominal hysterectomy and Coronary artery bypass graft.  Allergies  Allergen Reactions  . Lanolin Rash    No current  facility-administered medications on file prior to encounter.    Current Outpatient Medications on File Prior to Encounter  Medication Sig  . amiodarone (PACERONE) 200 MG tablet Take 2 tablets (400 mg total) 2 (two) times daily by mouth. After 1 week start 200 mg bid (Patient taking differently: Take 50 mg by mouth 2 (two) times daily. )  . arformoterol (BROVANA) 15 MCG/2ML NEBU Take 15 mcg by nebulization 2 (two) times daily.  Marland Kitchen. aspirin (ASPIRIN CHILDRENS) 81 MG chewable tablet Chew 1 tablet (81 mg total) by mouth daily.  Marland Kitchen. atorvastatin (LIPITOR) 20 MG tablet Take 1 tablet (20 mg total) by mouth daily. (Patient taking differently: Take 20 mg by mouth every other day. )  . busPIRone (BUSPAR) 10 MG tablet Take 10 mg by mouth 2 (two) times daily.  . carvedilol (COREG) 6.25 MG tablet Take 1 tablet (6.25 mg total) 2 (two) times daily with a meal by mouth. (Patient taking differently: Take 3.125 mg by mouth 2 (two) times daily with a meal. )  . clopidogrel (PLAVIX) 75 MG tablet Take 75 mg by mouth daily.  Marland Kitchen. ipratropium-albuterol (DUONEB) 0.5-2.5 (3) MG/3ML SOLN Take 3 mLs by nebulization every 8 (eight) hours as needed (cough).  . isosorbide mononitrate (IMDUR) 30 MG 24 hr tablet Take 30 mg by mouth daily.  . nitroGLYCERIN (NITROSTAT) 0.4 MG SL tablet Place 1 tablet (0.4 mg total) under the tongue every 5 (five) minutes as needed for chest pain.  . sacubitril-valsartan (ENTRESTO) 24-26 MG Take 0.5 tablets by mouth 2 (two) times daily.  Marland Kitchen. spironolactone (ALDACTONE) 25 MG tablet Take  12.5 mg by mouth daily.  Marland Kitchen torsemide (DEMADEX) 20 MG tablet Take 1 tablet (20 mg total) by mouth daily.  . digoxin (LANOXIN) 0.125 MG tablet Take 1 tablet (0.125 mg total) daily by mouth. (Patient not taking: Reported on 10/13/2017)    FAMILY HISTORY:  Her has no family status information on file.    SOCIAL HISTORY: She  reports that  has never smoked. she has never used smokeless tobacco. She reports that she does not  drink alcohol.  REVIEW OF SYSTEMS:   Unable to obtain as patient is intubated and sedated  SUBJECTIVE:   VITAL SIGNS: BP 105/74   Pulse (!) 102   Temp 98 F (36.7 C) (Oral)   Resp 20   Wt 220 lb 7.4 oz (100 kg)   SpO2 97%   BMI 35.58 kg/m   HEMODYNAMICS:    VENTILATOR SETTINGS: Vent Mode: AC FiO2 (%):  [40 %-100 %] 100 % Set Rate:  [18 bmp] 18 bmp Vt Set:  [500 mL] 500 mL PEEP:  [5 cmH20] 5 cmH20  INTAKE / OUTPUT: No intake/output data recorded.  PHYSICAL EXAMINATION: General: Chronically ill looking Neuro: Withdraws to pain, pupils are sluggish, gag reflex intact HEENT: Trachea midline, neck is supple with no JVD Cardiovascular: Apical pulse irregular, S1-S2, no murmur regurg or gallop, +2 pulses, +2 nonpitting edema Lungs: Bilateral breath sounds without any rhonchi or wheezes Abdomen: Nondistended, normal bowel sounds in all 4 quadrants, palpation reveals no organomegaly Musculoskeletal: Positive range of motion in upper and lower extremities no joint deformities patient is bed confined  Skin: Warm and dry, no rash or lesions, venous stasis discoloration in bilateral lower extremities  LABS:  BMET Recent Labs  Lab 10/19/2017 1828  NA 143  K 5.3*  CL 110  CO2 21*  BUN 57*  CREATININE 1.68*  GLUCOSE 179*    Electrolytes Recent Labs  Lab 11/10/2017 1828  CALCIUM 7.4*    CBC Recent Labs  Lab 10/21/2017 1828  WBC 3.8  HGB 2.8*  HCT 9.6*  PLT 170    Coag's No results for input(s): APTT, INR in the last 168 hours.  Sepsis Markers Recent Labs  Lab 10/21/2017 1828 11/05/2017 2014  LATICACIDVEN 9.3* 6.7*    ABG Recent Labs  Lab 11/03/2017 1930  PHART 7.28*  PCO2ART 35  PO2ART 404*    Liver Enzymes Recent Labs  Lab 10/21/2017 1828  AST 26  ALT 11*  ALKPHOS 32*  BILITOT 0.8  ALBUMIN 2.1*    Cardiac Enzymes Recent Labs  Lab 10/13/2017 1828 10/11/2017 2006  TROPONINI <0.03 0.03*    Glucose Recent Labs  Lab 11/05/2017 2123  GLUCAP  214*    Imaging Dg Chest Port 1 View  Result Date: 11/01/2017 CLINICAL DATA:  Central line placement. EXAM: PORTABLE CHEST 1 VIEW COMPARISON:  October 20, 2017 FINDINGS: The ETT is in good position. A new left central line terminates in the central SVC. No pneumothorax. The distal tip of the NG tube appears to be near the GE junction. This is stable. Cardiomegaly persists. The hila and mediastinum are unchanged. Mild opacity in the medial right lung base could represent vascular crowding and/or atelectasis. No suspicious infiltrate to suggest pneumonia. IMPRESSION: 1. New left central line terminates in the central SVC with no pneumothorax. The ETT is in good position. 2. The distal tip of the NG tube is near the GE junction. The patient may benefit from advancement. 3. No other acute abnormalities. Electronically Signed   By:  Gerome Sam III M.D   On: 11/10/2017 19:43   Dg Chest Portable 1 View  Result Date: 10/19/2017 CLINICAL DATA:  Status post intubation EXAM: PORTABLE CHEST 1 VIEW COMPARISON:  11/18/2009 FINDINGS: Cardiac shadow is mildly enlarged but stable. Aortic calcifications are again seen. Endotracheal tube is noted in satisfactory position. The nasogastric catheter extends into the distal esophagus but does not reach the stomach. This should be advanced and reimaged. The lungs are well aerated bilaterally. Minimal left basilar atelectatic changes are seen. No acute bony abnormality is noted. IMPRESSION: Tubes and lines as described above. The endotracheal tube should be advanced and reimaged. Electronically Signed   By: Alcide Clever M.D.   On: 10/21/2017 19:00     STUDIES:  ECHO 06/17/2017 LV EF: 35% Severe LV systolic dysfunction with moderate MR/TR, and severe   pulmonary HTN. CULTURES: Cultures x2 Urine culture ANTIBIOTICS: Unasyn Vancomycin-discontinued  SIGNIFICANT EVENTS: 03/10: Admitted  LINES/TUBES: Left subclavian central venous catheter Foley  catheter ETT PIVs  DISCUSSION: 84 year old female with multiple comorbidities presenting with acute GI bleed, acute hypoxic respiratory failure secondary to severe acute blood loss anemia, acute renal failure, severe lactic acidosis, hypokalemia and acute metabolic encephalopathy  ASSESSMENT  Septic and hypovolemic shock Acute hypoxic respiratory failure Acute GI bleed-hemoglobin down to 2.8 from baseline of 8.3; looking at the current trend, patient's hemoglobin was 11.3 in September 2018 and dropped to 8.3 in November of last year and now 2.8 Severe lactic acidosis Leukocytosis Aspiration pneumonitis-potential risk for aspiration given acute change in mentation Acute metabolic encephalopathy-rule out CVA Acute blood loss anemia Atrial fibrillation Congestive heart failure Type 2 diabetes mellitus with renal complications Hyperlipidemia Hypertension  PLAN Hemodynamic monitoring per ICU protocol IV fluids and pressors to maintain mean arterial blood pressure 65 and above Continue epinephrine, vasopressin and norepinephrine infusions and titrate to blood pressure goals Continue Unasyn for possible aspiration pneumonitis Hold all antiplatelets and anticoagulants Hold all antihypertensives in light of shock Repeat 2D echo Transfuse blood products as ordered Trend hemoglobin and hematocrit Protonix infusion per GI Gastroenterology consulted and following CT head pending Monitor and replace electrolytes No pharmacologic DVT prophylaxis SCDs for DVT prophylaxis GI prophylaxis-already on a Protonix infusion  FAMILY  - Updates: Son updated by nursing.  Declined any updates by this Clinical research associate  - Inter-disciplinary family meet or Palliative Care meeting due by:  day 7   Magdalene S. [facility] ANP-BC Pulmonary and Critical Care Medicine [facility] Pager [phone] or [phone]  NB: This document was prepared using Dragon voice recognition software and may include  unintentional dictation errors.    10/23/2017, 9:42 PM

## 2017-10-20 NOTE — ED Notes (Signed)
 1mg  epi in 500 cc's NS given at this time.

## 2017-10-20 NOTE — ED Notes (Signed)
1st unit of blood completed at this time. 

## 2017-10-20 NOTE — Progress Notes (Signed)
Vasopressin started and epi gtt wean started per Barbette OrMagdelene Tukov, NP orders.

## 2017-10-20 NOTE — Progress Notes (Signed)
Temp foley reading 34.4C.  Bair hugger placed on pt.

## 2017-10-20 NOTE — H&P (Signed)
[employer] - [facility] at [facility]   PATIENT NAME: Faith Brown    MR#:  [id number]  DATE OF BIRTH:  09/12/1924  DATE OF ADMISSION:  2017/10/23  PRIMARY CARE PHYSICIAN: Patient, No Pcp Per   REQUESTING/REFERRING PHYSICIAN: Drexel Iha MD  CHIEF COMPLAINT:   Chief Complaint  Patient presents with  . Altered Mental Status  . Respiratory Distress    HISTORY OF PRESENT ILLNESS: Faith Brown  is a 84 y.o. female with a known history of atrial fibrillation, congestive heart failure, diabetes, essential hypertension, hyperlipidemia who currently resides in a skilled nursing facility and normally is minimally interactive however today she was noticed to have worsening and hard to predict responsiveness.  Therefore she was brought to the emergency room.  Patient when she arrived was minimally communicative.  She was pale and her tongue and her face.  Patient initially had to be placed on BiPAP.  Subsequent blood work revealed her to have a hemoglobin that was very low.  Patient had to be intubated.  She had to be started on pressors to support her blood pressure.  Pt son was contacted regarding patient's condition and he wanted everything done. PAST MEDICAL HISTORY:   Past Medical History:  Diagnosis Date  . Atrial fibrillation (HCC)   . CHF (congestive heart failure) (HCC)   . Diabetes mellitus without complication (HCC)   . Hyperlipemia   . Hypertension   . Shortness of breath dyspnea     PAST SURGICAL HISTORY:  Past Surgical History:  Procedure Laterality Date  . ABDOMINAL HYSTERECTOMY    . CORONARY ARTERY BYPASS GRAFT      SOCIAL HISTORY:  Social History   Tobacco Use  . Smoking status: Never Smoker  . Smokeless tobacco: Never Used  Substance Use Topics  . Alcohol use: No    FAMILY HISTORY:  Family History  Family history unknown: Yes    DRUG ALLERGIES:  Allergies  Allergen Reactions  . Lanolin Rash    REVIEW OF SYSTEMS:    CONSTITUTIONAL: Patient is currently unresponsive intubated   MEDICATIONS AT HOME:  Prior to Admission medications   Medication Sig Start Date End Date Taking? Authorizing Provider  amiodarone (PACERONE) 200 MG tablet Take 2 tablets (400 mg total) 2 (two) times daily by mouth. After 1 week start 200 mg bid Patient taking differently: Take 50 mg by mouth 2 (two) times daily.  06/19/17  Yes Enedina Finner, MD  arformoterol (BROVANA) 15 MCG/2ML NEBU Take 15 mcg by nebulization 2 (two) times daily.   Yes [provider]  aspirin (ASPIRIN CHILDRENS) 81 MG chewable tablet Chew 1 tablet (81 mg total) by mouth daily. 04/26/17  Yes Emily Filbert, MD  atorvastatin (LIPITOR) 20 MG tablet Take 1 tablet (20 mg total) by mouth daily. Patient taking differently: Take 20 mg by mouth every other day.  04/26/17  Yes Emily Filbert, MD  busPIRone (BUSPAR) 10 MG tablet Take 10 mg by mouth 2 (two) times daily.   Yes [provider]  carvedilol (COREG) 6.25 MG tablet Take 1 tablet (6.25 mg total) 2 (two) times daily with a meal by mouth. Patient taking differently: Take 3.125 mg by mouth 2 (two) times daily with a meal.  06/19/17  Yes Enedina Finner, MD  clopidogrel (PLAVIX) 75 MG tablet Take 75 mg by mouth daily.   Yes [provider]  ipratropium-albuterol (DUONEB) 0.5-2.5 (3) MG/3ML SOLN Take 3 mLs by nebulization every 8 (eight) hours as needed (cough).  Yes [provider]  isosorbide mononitrate (IMDUR) 30 MG 24 hr tablet Take 30 mg by mouth daily.   Yes [provider]  nitroGLYCERIN (NITROSTAT) 0.4 MG SL tablet Place 1 tablet (0.4 mg total) under the tongue every 5 (five) minutes as needed for chest pain. 04/26/17  Yes Emily FilbertWilliams, Jonathan E, MD  sacubitril-valsartan (ENTRESTO) 24-26 MG Take 0.5 tablets by mouth 2 (two) times daily.   Yes [provider]  spironolactone (ALDACTONE) 25 MG tablet Take 12.5 mg by mouth daily.   Yes [provider]  torsemide (DEMADEX) 20 MG tablet Take 1 tablet (20 mg total) by mouth daily. 04/26/17  Yes Emily FilbertWilliams, Jonathan E, MD  digoxin (LANOXIN) 0.125 MG tablet Take 1 tablet (0.125 mg total) daily by mouth. Patient not taking: Reported on 04-12-2018 06/20/17   Enedina FinnerPatel, Sona, MD      PHYSICAL EXAMINATION:   VITAL SIGNS: Blood pressure (!) 71/51, pulse 85, temperature 98 F (36.7 C), temperature source Oral, resp. rate (!) 24, weight 220 lb 7.4 oz (100 kg), SpO2 100 %.  GENERAL:  84 y.o.-year-old patient lying in the bed critically ill EYES: Pupils equal, round, reactive to light and accommodation. No scleral icterus. Extraocular muscles intact.  HEENT: Head atraumatic, normocephalic. Oropharynx and nasopharynx clear.  Intubated NECK:  Supple, no jugular venous distention. No thyroid enlargement, no tenderness.  LUNGS: Decreased breath sounds bilaterally CARDIOVASCULAR: S1, S2 normal. No murmurs, rubs, or gallops.  ABDOMEN: Soft, nontender, nondistended. Bowel sounds present. No organomegaly or mass.  EXTREMITIES: No pedal edema, cyanosis, or clubbing.  NEUROLOGIC: Sedated  pSYCHIATRIC: Sedated intubated  sKIN: No obvious rash, lesion, or ulcer.   LABORATORY PANEL:   CBC Recent Labs  Lab 13-Nov-2017 1828  WBC 3.8  HGB 2.8*  HCT 9.6*  PLT 170  MCV 84.9  MCH 24.8*  MCHC 29.2*  RDW 20.0*  LYMPHSABS 1.1  MONOABS 0.1*  EOSABS 0.0  BASOSABS 0.0   ------------------------------------------------------------------------------------------------------------------  Chemistries  Recent Labs  Lab 13-Nov-2017 1828  NA 143  K 5.3*  CL 110  CO2 21*  GLUCOSE 179*  BUN 57*  CREATININE 1.68*  CALCIUM 7.4*  AST 26  ALT 11*  ALKPHOS 32*  BILITOT 0.8   ------------------------------------------------------------------------------------------------------------------ estimated creatinine clearance is 26.6 mL/min (A) (by C-G formula based on SCr of 1.68 mg/dL  (H)). ------------------------------------------------------------------------------------------------------------------ No results for input(s): TSH, T4TOTAL, T3FREE, THYROIDAB in the last 72 hours.  Invalid input(s): FREET3   Coagulation profile No results for input(s): INR, PROTIME in the last 168 hours. ------------------------------------------------------------------------------------------------------------------- No results for input(s): DDIMER in the last 72 hours. -------------------------------------------------------------------------------------------------------------------  Cardiac Enzymes Recent Labs  Lab 13-Nov-2017 1828  TROPONINI <0.03   ------------------------------------------------------------------------------------------------------------------ Invalid input(s): POCBNP  ---------------------------------------------------------------------------------------------------------------  Urinalysis    Component Value Date/Time   COLORURINE COLORLESS (A) 09/01/2015 1356   APPEARANCEUR CLEAR (A) 09/01/2015 1356   LABSPEC 1.004 (L) 09/01/2015 1356   PHURINE 5.0 09/01/2015 1356   GLUCOSEU NEGATIVE 09/01/2015 1356   HGBUR 1+ (A) 09/01/2015 1356   BILIRUBINUR NEGATIVE 09/01/2015 1356   KETONESUR NEGATIVE 09/01/2015 1356   PROTEINUR NEGATIVE 09/01/2015 1356   NITRITE NEGATIVE 09/01/2015 1356   LEUKOCYTESUR TRACE (A) 09/01/2015 1356     RADIOLOGY: Dg Chest Portable 1 View  Result Date: 04-12-2018 CLINICAL DATA:  Status post intubation EXAM: PORTABLE CHEST 1 VIEW COMPARISON:  11/18/2009 FINDINGS: Cardiac shadow is mildly enlarged but stable. Aortic calcifications are again seen. Endotracheal tube is noted in satisfactory position. The nasogastric catheter extends into the  distal esophagus but does not reach the stomach. This should be advanced and reimaged. The lungs are well aerated bilaterally. Minimal left basilar atelectatic changes are seen. No acute bony  abnormality is noted. IMPRESSION: Tubes and lines as described above. The endotracheal tube should be advanced and reimaged. Electronically Signed   By: Alcide Clever M.D.   On: 10/24/2017 19:00    EKG: Orders placed or performed during the hospital encounter of 10/31/2017  . EKG 12-Lead  . EKG 12-Lead  . ED EKG  . ED EKG    IMPRESSION AND PLAN: The patient is a 84 year old who is presenting to the emergency room with decreased responsiveness  1.  Anemia suspect that this is progressive when they put NG tube in which patient did have some blood but not significant She will received 3 units of blood, she will be started on Protonix GI consult has been placed  2.  Acute respiratory failure continue ventilator Further therapy per the intensivist  3.  Hypotension related to #1 give aggressive IV fluids pressors  4.  Diabetes type 2 Place on sliding scale insulin check blood sugars every 6 hours  5.  History of congestive heart failure monitor fluid status  6.  CODE STATUS full prognosis poor if no significant improvement would recommend palliative care consult   All the records are reviewed and case discussed with ED provider. Management plans discussed with the patient, family and they are in agreement.  CODE STATUS: Code Status History    Date Active Date Inactive Code Status Order ID Comments User Context   06/17/2017 00:54 06/19/2017 20:32 Full Code [id number]  Oralia Manis, MD Inpatient   09/01/2015 15:49 09/05/2015 18:48 Full Code [id number]  Gale Journey, MD Inpatient       TOTAL TIME TAKING CARE OF THIS PATIENT:55 minutes of critical care time spent  Auburn Bilberry M.D on 11/09/2017 at 7:35 PM  Between 7am to 6pm - Pager - [phone]  After 6pm go to [email or web address] - password [email or web address] [facility]  [employer] Office  [phone]  CC: Primary care physician; Patient, No Pcp Per

## 2017-10-20 NOTE — ED Notes (Signed)
2nd unit of emergency release blood started by this RN, verified by Durene CalHunter, RN.

## 2017-10-20 NOTE — ED Provider Notes (Signed)
[facility] Emergency Department Provider Note  ____________________________________________  Time seen: Approximately 7:57 PM  I have reviewed the triage vital signs and the nursing notes.   HISTORY  Chief Complaint Altered Mental Status and Respiratory Distress  Level 5 caveat:  Portions of the history and physical were unable to be obtained due to ams   HPI Faith Brown is a 84 y.o. female with a history of atrial fibrillation, CHF, diabetes, hypertension, hyperlipidemia, CAD on Plavix who presents for evaluation of altered mental status. According to EMS patient has had progressively worse mentation throughout the day today. No fever, no vomiting, no diarrhea. No further history was provided by the nursing home. Patient was found to be hypoxic and altered. She would wake up and answer questions after sternal rub. She arrived on a nonrebreather.  Past Medical History:  Diagnosis Date  . Atrial fibrillation (HCC)   . CHF (congestive heart failure) (HCC)   . Diabetes mellitus without complication (HCC)   . Hyperlipemia   . Hypertension   . Shortness of breath dyspnea     Patient Active Problem List   Diagnosis Date Noted  . Acute encephalopathy 10/16/2017  . Pressure injury of skin 06/17/2017  . Atrial fibrillation with RVR (HCC) 06/16/2017  . HTN (hypertension) 06/16/2017  . HLD (hyperlipidemia) 06/16/2017  . Diabetes (HCC) 06/16/2017  . AKI (acute kidney injury) (HCC) 06/16/2017  . Acute on chronic diastolic CHF (congestive heart failure) (HCC) 09/01/2015    Past Surgical History:  Procedure Laterality Date  . ABDOMINAL HYSTERECTOMY    . CORONARY ARTERY BYPASS GRAFT      Prior to Admission medications   Medication Sig Start Date End Date Taking? Authorizing Provider  amiodarone (PACERONE) 200 MG tablet Take 2 tablets (400 mg total) 2 (two) times daily by mouth. After 1 week start 200 mg bid Patient taking differently: Take 50 mg by  mouth 2 (two) times daily.  06/19/17  Yes Enedina Finner, MD  arformoterol (BROVANA) 15 MCG/2ML NEBU Take 15 mcg by nebulization 2 (two) times daily.   Yes [provider]  aspirin (ASPIRIN CHILDRENS) 81 MG chewable tablet Chew 1 tablet (81 mg total) by mouth daily. 04/26/17  Yes Emily Filbert, MD  atorvastatin (LIPITOR) 20 MG tablet Take 1 tablet (20 mg total) by mouth daily. Patient taking differently: Take 20 mg by mouth every other day.  04/26/17  Yes Emily Filbert, MD  busPIRone (BUSPAR) 10 MG tablet Take 10 mg by mouth 2 (two) times daily.   Yes [provider]  carvedilol (COREG) 6.25 MG tablet Take 1 tablet (6.25 mg total) 2 (two) times daily with a meal by mouth. Patient taking differently: Take 3.125 mg by mouth 2 (two) times daily with a meal.  06/19/17  Yes Enedina Finner, MD  clopidogrel (PLAVIX) 75 MG tablet Take 75 mg by mouth daily.   Yes [provider]  ipratropium-albuterol (DUONEB) 0.5-2.5 (3) MG/3ML SOLN Take 3 mLs by nebulization every 8 (eight) hours as needed (cough).   Yes [provider]  isosorbide mononitrate (IMDUR) 30 MG 24 hr tablet Take 30 mg by mouth daily.   Yes [provider]  nitroGLYCERIN (NITROSTAT) 0.4 MG SL tablet Place 1 tablet (0.4 mg total) under the tongue every 5 (five) minutes as needed for chest pain. 04/26/17  Yes Emily Filbert, MD  sacubitril-valsartan (ENTRESTO) 24-26 MG Take 0.5 tablets by mouth 2 (two) times daily.   Yes [provider]  spironolactone (ALDACTONE) 25 MG tablet Take 12.5 mg by mouth daily.   Yes [provider]  torsemide (DEMADEX) 20 MG tablet Take 1 tablet (20 mg total) by mouth daily. 04/26/17  Yes Emily Filbert, MD  digoxin (LANOXIN) 0.125 MG tablet Take 1 tablet (0.125 mg total) daily by mouth. Patient not taking: Reported on 11/05/2017 06/20/17   Enedina Finner, MD    Allergies Lanolin  Family History  Family history unknown: Yes    Social  History Social History   Tobacco Use  . Smoking status: Never Smoker  . Smokeless tobacco: Never Used  Substance Use Topics  . Alcohol use: No  . Drug use: Not on file    Review of Systems  Constitutional: Negative for fever. + AMS Gastrointestinal: Negative for vomiting or diarrhea.   Level 5 caveat:  Portions of the history and physical were unable to be obtained due to ams  ____________________________________________   PHYSICAL EXAM:  VITAL SIGNS: ED Triage Vitals  Enc Vitals Group     BP 10/30/2017 1810 (!) 67/42     Pulse Rate 10/26/2017 1810 95     Resp 10/23/2017 1810 (!) 26     Temp 10/15/2017 1810 98 F (36.7 C)     Temp Source 10/28/2017 1810 Oral     SpO2 10/21/2017 1810 100 %     Weight 10/21/2017 1812 220 lb 7.4 oz (100 kg)     Height --      Head Circumference --      Peak Flow --      Pain Score --      Pain Loc --      Pain Edu? --      Excl. in GC? --     Constitutional: Awake on arrival, answering questions, following commands. After 10 min patient started to become less responsive and started to vomit coffee ground emesis HEENT:      Head: Normocephalic and atraumatic.         Eyes: Pale conjunctiva       Mouth/Throat: Pale tongue, poor dentition      Neck: Supple with no signs of meningismus. Cardiovascular: Regular rate and rhythm. No murmurs, gallops, or rubs.  Respiratory: Normal respiratory effort. Coarse bilateral breath sounds Gastrointestinal: Soft, non tender, and non distended with positive bowel sounds. No rebound or guarding. Musculoskeletal: No edema, cyanosis, or erythema of extremities. Neurologic:  Face is symmetric. Moving all extremities.  Skin: Skin is warm, dry and intact. No rash noted. Psychiatric: Mood and affect are normal. Speech and behavior are normal.  ____________________________________________   LABS (all labs ordered are listed, but only abnormal results are displayed)  Labs Reviewed  CBC WITH DIFFERENTIAL/PLATELET  - Abnormal; Notable for the following components:      Result Value   RBC 1.14 (*)    Hemoglobin 2.8 (*)    HCT 9.6 (*)    MCH 24.8 (*)    MCHC 29.2 (*)    RDW 20.0 (*)    nRBC 4 (*)    Monocytes Absolute 0.1 (*)    All other components within normal limits  COMPREHENSIVE METABOLIC PANEL - Abnormal; Notable for the following components:   Potassium 5.3 (*)    CO2 21 (*)    Glucose, Bld 179 (*)    BUN 57 (*)    Creatinine, Ser 1.68 (*)    Calcium 7.4 (*)    Total Protein 4.9 (*)    Albumin 2.1 (*)  ALT 11 (*)    Alkaline Phosphatase 32 (*)    GFR calc non Af Amer 26 (*)    GFR calc Af Amer 30 (*)    All other components within normal limits  LACTIC ACID, PLASMA - Abnormal; Notable for the following components:   Lactic Acid, Venous 9.3 (*)    All other components within normal limits  BLOOD GAS, VENOUS - Abnormal; Notable for the following components:   pH, Ven 7.24 (*)    Acid-base deficit 6.6 (*)    All other components within normal limits  BLOOD GAS, ARTERIAL - Abnormal; Notable for the following components:   pH, Arterial 7.28 (*)    pO2, Arterial 404 (*)    Bicarbonate 16.4 (*)    Acid-base deficit 9.4 (*)    All other components within normal limits  CULTURE, BLOOD (ROUTINE X 2)  CULTURE, BLOOD (ROUTINE X 2)  TROPONIN I  LACTIC ACID, PLASMA  TROPONIN I  TROPONIN I  TROPONIN I  CBC  BASIC METABOLIC PANEL  HEMOGLOBIN  HEMOGLOBIN  TYPE AND SCREEN  ABO/RH  PREPARE RBC (CROSSMATCH)  PREPARE RBC (CROSSMATCH)   ____________________________________________  EKG  ED ECG REPORT I, Nita Sickle, the attending physician, personally viewed and interpreted this ECG.  Atrial fibrillation, rate of 91, right bundle branch block, prolonged QTC, no ST elevation or depression. ____________________________________________  RADIOLOGY  I have personally reviewed the images performed during this visit and I agree with the Radiologist's read.   Interpretation  by Radiologist:  Dg Chest Port 1 View  Result Date: 13-Nov-2017 CLINICAL DATA:  Central line placement. EXAM: PORTABLE CHEST 1 VIEW COMPARISON:  11-13-17 FINDINGS: The ETT is in good position. A new left central line terminates in the central SVC. No pneumothorax. The distal tip of the NG tube appears to be near the GE junction. This is stable. Cardiomegaly persists. The hila and mediastinum are unchanged. Mild opacity in the medial right lung base could represent vascular crowding and/or atelectasis. No suspicious infiltrate to suggest pneumonia. IMPRESSION: 1. New left central line terminates in the central SVC with no pneumothorax. The ETT is in good position. 2. The distal tip of the NG tube is near the GE junction. The patient may benefit from advancement. 3. No other acute abnormalities. Electronically Signed   By: Gerome Sam III M.D   On: 11-13-17 19:43   Dg Chest Portable 1 View  Result Date: 11/13/17 CLINICAL DATA:  Status post intubation EXAM: PORTABLE CHEST 1 VIEW COMPARISON:  11/18/2009 FINDINGS: Cardiac shadow is mildly enlarged but stable. Aortic calcifications are again seen. Endotracheal tube is noted in satisfactory position. The nasogastric catheter extends into the distal esophagus but does not reach the stomach. This should be advanced and reimaged. The lungs are well aerated bilaterally. Minimal left basilar atelectatic changes are seen. No acute bony abnormality is noted. IMPRESSION: Tubes and lines as described above. The endotracheal tube should be advanced and reimaged. Electronically Signed   By: Alcide Clever M.D.   On: 11/13/17 19:00    ____________________________________________   PROCEDURES  Procedure(s) performed: yes Procedure Name: Intubation Date/Time: 11-13-2017 8:02 PM Performed by: Nita Sickle, MD Pre-anesthesia Checklist: Patient identified, Patient being monitored, Emergency Drugs available, Timeout performed and Suction  available Oxygen Delivery Method: Non-rebreather mask Preoxygenation: Pre-oxygenation with 100% oxygen Induction Type: Rapid sequence Ventilation: Mask ventilation without difficulty Laryngoscope Size: Glidescope and 3 Tube size: 7.5 mm Number of attempts: 1 Airway Equipment and Method: Video-laryngoscopy Placement  Confirmation: ETT inserted through vocal cords under direct vision,  CO2 detector and Breath sounds checked- equal and bilateral Secured at: 24 cm Tube secured with: ETT holder Dental Injury: Teeth and Oropharynx as per pre-operative assessment     .Central Line Date/Time: 10/19/2017 8:03 PM Performed by: Nita Sickle, MD Authorized by: Nita Sickle, MD   Consent:    Consent obtained:  Emergent situation Pre-procedure details:    Hand hygiene: Hand hygiene performed prior to insertion     Sterile barrier technique: All elements of maximal sterile technique followed     Skin preparation:  2% chlorhexidine   Skin preparation agent: Skin preparation agent completely dried prior to procedure   Procedure details:    Location:  L subclavian   Patient position:  Flat   Procedural supplies:  Triple lumen   Landmarks identified: yes     Ultrasound guidance: no     Number of attempts:  1   Successful placement: yes   Post-procedure details:    Post-procedure:  Dressing applied and line sutured   Assessment:  Blood return through all ports and free fluid flow   Patient tolerance of procedure:  Tolerated well, no immediate complications   Critical Care performed: yes  CRITICAL CARE Performed by: Nita Sickle  ?  Total critical care time: 60 min  Critical care time was exclusive of separately billable procedures and treating other patients.  Critical care was necessary to treat or prevent imminent or life-threatening deterioration.  Critical care was time spent personally by me on the following activities: development of treatment plan with patient  and/or surrogate as well as nursing, discussions with consultants, evaluation of patient's response to treatment, examination of patient, obtaining history from patient or surrogate, ordering and performing treatments and interventions, ordering and review of laboratory studies, ordering and review of radiographic studies, pulse oximetry and re-evaluation of patient's condition.  ____________________________________________   INITIAL IMPRESSION / ASSESSMENT AND PLAN / ED COURSE  84 y.o. female with a history of atrial fibrillation, CHF, diabetes, hypertension, hyperlipidemia, CAD on Plavix who presents for evaluation of altered mental status. Patient initially arrived her breathing and satting 100% on room air, answering simple questions like her name and following simple commands. After about 5-10 minutes of being in the emergency room patient became unresponsive and started vomiting coffee ground emesis. She became extremely hypotensive with systolics in the 50s. She was started on an epinephrine drip and IV fluids prior to intubation to prevent cardiac arrest. Patient was intubated for airway protection via RSI procedure note above. Patient was noted to be extremely pale and hypotensive and 1 U emergent blood was started on patient. Shortly after lab called to alert [place] of hgb 2.8. 2 more units of emergent blood was hung immediately. Central line was placed for better access for pressors and blood. Patient was started on propofol after good improvement of the blood pressure on epinephrine drip. An attempt was made to switch patient to norepinephrine however she became extremely hypotensive and unresponsive to norepi and was switched back to an epinephrine drip. NG tube has been placed with no drainage obtained. Concerned for hemorrhagic shock form low GIB.  GI has been consulted. ICU and Hospitalist consulted for admission. XR concerning for aspiration pneumonia for which patient was given Unasyn and  vancomycin. Son updated and is currently at bedside. Son confirmed patient to be FULL CODE.      As part of my medical decision making, I reviewed the following data  within the electronic MEDICAL RECORD NUMBER Nursing notes reviewed and incorporated, Labs reviewed , EKG interpreted , Radiograph reviewed , Discussed with admitting physician , A consult was requested and obtained from this/these consultant(s) ICU and GI, Notes from prior ED visits and [place] Controlled Substance Database    Pertinent labs & imaging results that were available during my care of the patient were reviewed by me and considered in my medical decision making (see chart for details).    ____________________________________________   FINAL CLINICAL IMPRESSION(S) / ED DIAGNOSES  Final diagnoses:  Hemorrhagic shock (HCC)  UGIB (upper gastrointestinal bleed)  Acute respiratory failure with hypoxia (HCC)  Aspiration pneumonia due to gastric secretions, unspecified laterality, unspecified part of lung (HCC)      NEW MEDICATIONS STARTED DURING THIS VISIT:  ED Discharge Orders    None       Note:  This document was prepared using Dragon voice recognition software and may include unintentional dictation errors.    Nita SickleVeronese, [place], MD 01/08/2018 2009

## 2017-10-20 NOTE — Progress Notes (Signed)
eLink Physician-Brief Progress Note Patient Name: Mckinley JewelLillian Candler DOB: 04/26/1925 MRN: [id number]   Date of Service  11/07/2017  HPI/Events of Note  84 yo female who is SNF resident. Presents with ALOC. Failed BiPAP and now intubated and ventilated.Hgb = 3.0. Coffee grounds from gastric tube. Now on Protonix, Epinephrine and vasopressin IV infusions. Family wants full code status. PCCM asked to assume care in the ICU. VSS.   eICU Interventions  No new orders.     Intervention Category Evaluation Type: New Patient Evaluation  Lenell AntuSommer,Steven Eugene 10/21/2017, 10:33 PM

## 2017-10-20 NOTE — ED Notes (Signed)
3rd unit of emergency release blood started at this time.

## 2017-10-20 NOTE — ED Notes (Signed)
Blood noted in patient's vomit at this time. Per MD, 1 unit emergency release blood.

## 2017-10-20 NOTE — ED Notes (Signed)
Emergent release blood started by this RN and Marylene LandAngela RN at a rate off 999.

## 2017-10-20 NOTE — ED Notes (Signed)
Date and time results received: 11/05/2017 8:51 PM  (use smartphrase ".now" to insert current time)  Test: lactic, Trop  Critical Value: 6.7, 0.03  Name of Provider Notified: Dr. Allena KatzPatel  Orders Received? Or Actions Taken?: Critical Results Acknowledged

## 2017-10-21 ENCOUNTER — Inpatient Hospital Stay: Payer: Medicare Other

## 2017-10-21 ENCOUNTER — Inpatient Hospital Stay (HOSPITAL_COMMUNITY)
Admit: 2017-10-21 | Discharge: 2017-10-21 | Disposition: A | Discharge status: Home / Self Care | Payer: Medicare Other | Attending: Adult Health | Admitting: Adult Health

## 2017-10-21 DIAGNOSIS — K922 Gastrointestinal hemorrhage, unspecified: Secondary | ICD-10-CM

## 2017-10-21 DIAGNOSIS — I350 Nonrheumatic aortic (valve) stenosis: Secondary | ICD-10-CM

## 2017-10-21 DIAGNOSIS — R578 Other shock: Secondary | ICD-10-CM

## 2017-10-21 DIAGNOSIS — K625 Hemorrhage of anus and rectum: Secondary | ICD-10-CM

## 2017-10-21 LAB — CBC
HCT: 23.3 % — ABNORMAL LOW (ref 35.0–47.0)
HEMATOCRIT: 20.7 % — AB (ref 35.0–47.0)
HEMATOCRIT: 24.4 % — AB (ref 35.0–47.0)
HEMOGLOBIN: 7.9 g/dL — AB (ref 12.0–16.0)
Hemoglobin: 6.7 g/dL — ABNORMAL LOW (ref 12.0–16.0)
Hemoglobin: 7.6 g/dL — ABNORMAL LOW (ref 12.0–16.0)
MCH: 27.1 pg (ref 26.0–34.0)
MCH: 27.6 pg (ref 26.0–34.0)
MCH: 27.9 pg (ref 26.0–34.0)
MCHC: 32.6 g/dL (ref 32.0–36.0)
MCHC: 32.4 g/dL (ref 32.0–36.0)
MCHC: 32.8 g/dL (ref 32.0–36.0)
MCV: 83.2 fL (ref 80.0–100.0)
MCV: 84.4 fL (ref 80.0–100.0)
MCV: 86.3 fL (ref 80.0–100.0)
PLATELETS: 139 10*3/uL — AB (ref 150–440)
PLATELETS: 167 10*3/uL (ref 150–440)
Platelets: 79 10*3/uL — ABNORMAL LOW (ref 150–440)
RBC: 2.49 MIL/uL — ABNORMAL LOW (ref 3.80–5.20)
RBC: 2.77 MIL/uL — ABNORMAL LOW (ref 3.80–5.20)
RBC: 2.83 MIL/uL — ABNORMAL LOW (ref 3.80–5.20)
RDW: 15.4 % — ABNORMAL HIGH (ref 11.5–14.5)
RDW: 15.2 % — AB (ref 11.5–14.5)
RDW: 15.9 % — ABNORMAL HIGH (ref 11.5–14.5)
WBC: 21.7 10*3/uL — ABNORMAL HIGH (ref 3.6–11.0)
WBC: 13.1 10*3/uL — AB (ref 3.6–11.0)
WBC: 15.9 10*3/uL — ABNORMAL HIGH (ref 3.6–11.0)

## 2017-10-21 LAB — TROPONIN I
Troponin I: 0.13 ng/mL (ref <0.03)
Troponin I: 0.48 ng/mL (ref <0.03)

## 2017-10-21 LAB — PROCALCITONIN
PROCALCITONIN: 0.55 ng/mL
PROCALCITONIN: 3.57 ng/mL

## 2017-10-21 LAB — HEMOGLOBIN: Hemoglobin: 8.8 g/dL — ABNORMAL LOW (ref 12.0–16.0)

## 2017-10-21 LAB — BASIC METABOLIC PANEL
Anion gap: 8 (ref 5–15)
Anion gap: 10 (ref 5–15)
BUN: 52 mg/dL — AB (ref 6–20)
BUN: 49 mg/dL — ABNORMAL HIGH (ref 6–20)
CHLORIDE: 116 mmol/L — AB (ref 101–111)
CO2: 20 mmol/L — AB (ref 22–32)
CO2: 13 mmol/L — ABNORMAL LOW (ref 22–32)
CREATININE: 1.38 mg/dL — AB (ref 0.44–1.00)
Calcium: 6.7 mg/dL — ABNORMAL LOW (ref 8.9–10.3)
Calcium: 6.3 mg/dL — CL (ref 8.9–10.3)
Chloride: 110 mmol/L (ref 101–111)
Creatinine, Ser: 1.4 mg/dL — ABNORMAL HIGH (ref 0.44–1.00)
GFR calc Af Amer: 37 mL/min — ABNORMAL LOW (ref >60)
GFR calc non Af Amer: 33 mL/min — ABNORMAL LOW (ref >60)
GFR, EST AFRICAN AMERICAN: 38 mL/min — AB (ref >60)
GFR, EST NON AFRICAN AMERICAN: 32 mL/min — AB (ref >60)
GLUCOSE: 155 mg/dL — AB (ref 65–99)
Glucose, Bld: 238 mg/dL — ABNORMAL HIGH (ref 65–99)
Potassium: 3.3 mmol/L — ABNORMAL LOW (ref 3.5–5.1)
Potassium: 5.3 mmol/L — ABNORMAL HIGH (ref 3.5–5.1)
Sodium: 144 mmol/L (ref 135–145)
Sodium: 133 mmol/L — ABNORMAL LOW (ref 135–145)

## 2017-10-21 LAB — PHOSPHORUS
PHOSPHORUS: 2.9 mg/dL (ref 2.5–4.6)
PHOSPHORUS: 3.9 mg/dL (ref 2.5–4.6)
PHOSPHORUS: 4.1 mg/dL (ref 2.5–4.6)

## 2017-10-21 LAB — BLOOD GAS, ARTERIAL
Acid-base deficit: 9.3 mmol/L — ABNORMAL HIGH (ref 0.0–2.0)
BICARBONATE: 16.2 mmol/L — AB (ref 20.0–28.0)
FIO2: 0.3
LHR: 18 {breaths}/min
MECHANICAL RATE: 18
O2 Saturation: 88.6 %
PATIENT TEMPERATURE: 37
PEEP/CPAP: 5 cmH2O
VT: 500 mL
pCO2 arterial: 33 mmHg (ref 32.0–48.0)
pH, Arterial: 7.3 — ABNORMAL LOW (ref 7.350–7.450)
pO2, Arterial: 62 mmHg — ABNORMAL LOW (ref 83.0–108.0)

## 2017-10-21 LAB — ECHOCARDIOGRAM COMPLETE: WEIGHTICAEL: 3527.36 [oz_av]

## 2017-10-21 LAB — PROTIME-INR
INR: 1.29
Prothrombin Time: 16 seconds — ABNORMAL HIGH (ref 11.4–15.2)

## 2017-10-21 LAB — TRIGLYCERIDES: TRIGLYCERIDES: 118 mg/dL (ref <150)

## 2017-10-21 LAB — GLUCOSE, CAPILLARY
GLUCOSE-CAPILLARY: 163 mg/dL — AB (ref 65–99)
GLUCOSE-CAPILLARY: 92 mg/dL (ref 65–99)
GLUCOSE-CAPILLARY: 53 mg/dL — AB (ref 65–99)
GLUCOSE-CAPILLARY: 59 mg/dL — AB (ref 65–99)
Glucose-Capillary: 100 mg/dL — ABNORMAL HIGH (ref 65–99)
Glucose-Capillary: 191 mg/dL — ABNORMAL HIGH (ref 65–99)
Glucose-Capillary: 272 mg/dL — ABNORMAL HIGH (ref 65–99)
Glucose-Capillary: 46 mg/dL — ABNORMAL LOW (ref 65–99)
Glucose-Capillary: 93 mg/dL (ref 65–99)

## 2017-10-21 LAB — LACTIC ACID, PLASMA: Lactic Acid, Venous: 3.7 mmol/L (ref 0.5–1.9)

## 2017-10-21 LAB — FIBRINOGEN: Fibrinogen: 313 mg/dL (ref 210–475)

## 2017-10-21 LAB — AMMONIA: Ammonia: 36 umol/L — ABNORMAL HIGH (ref 9–35)

## 2017-10-21 LAB — FIBRIN DERIVATIVES D-DIMER (ARMC ONLY): Fibrin derivatives D-dimer (ARMC): 3135.94 ng/mL (FEU) — ABNORMAL HIGH (ref 0.00–499.00)

## 2017-10-21 LAB — MAGNESIUM
Magnesium: 1.5 mg/dL — ABNORMAL LOW (ref 1.7–2.4)
Magnesium: 1.9 mg/dL (ref 1.7–2.4)
Magnesium: 2.6 mg/dL — ABNORMAL HIGH (ref 1.7–2.4)

## 2017-10-21 LAB — MRSA PCR SCREENING: MRSA BY PCR: NEGATIVE

## 2017-10-21 LAB — CK: CK TOTAL: 42 U/L (ref 38–234)

## 2017-10-21 MED ORDER — DEXTROSE 50 % IV SOLN
25.0000 mL | Freq: Once | INTRAVENOUS | Status: AC
  Administered 2017-10-21: 25 mL via INTRAVENOUS
  Filled 2017-10-21: qty 50

## 2017-10-21 MED ORDER — CALCIUM CHLORIDE 10 % IV SOLN
2.0000 g | Freq: Once | INTRAVENOUS | Status: DC
  Administered 2017-10-21: 2 g via INTRAVENOUS

## 2017-10-21 MED ORDER — SODIUM BICARBONATE 8.4 % IV SOLN
150.0000 meq | Freq: Once | INTRAVENOUS | Status: AC
  Administered 2017-10-21: 150 meq via INTRAVENOUS

## 2017-10-21 MED ORDER — ORAL CARE MOUTH RINSE
15.0000 mL | OROMUCOSAL | Status: DC
  Administered 2017-10-21 (×7): 15 mL via OROMUCOSAL

## 2017-10-21 MED ORDER — SODIUM CHLORIDE 0.9 % IV SOLN
2.0000 g | Freq: Once | INTRAVENOUS | Status: DC
  Filled 2017-10-21: qty 20

## 2017-10-21 MED ORDER — DEXTROSE 5 % IV SOLN
0.0000 ug/min | INTRAVENOUS | Status: DC
  Administered 2017-10-21: 2 ug/min via INTRAVENOUS
  Administered 2017-10-21: 65 ug/min via INTRAVENOUS
  Administered 2017-10-21: 40 ug/min via INTRAVENOUS
  Filled 2017-10-21 (×3): qty 16

## 2017-10-21 MED ORDER — MAGNESIUM SULFATE 4 GM/100ML IV SOLN
4.0000 g | Freq: Once | INTRAVENOUS | Status: AC
  Administered 2017-10-21: 4 g via INTRAVENOUS
  Filled 2017-10-21: qty 100

## 2017-10-21 MED ORDER — SODIUM CHLORIDE 0.9 % IV BOLUS (SEPSIS)
1000.0000 mL | INTRAVENOUS | Status: AC
  Administered 2017-10-21 (×2): 1000 mL via INTRAVENOUS

## 2017-10-21 MED ORDER — SODIUM BICARBONATE 8.4 % IV SOLN
INTRAVENOUS | Status: DC
  Administered 2017-10-21: via INTRAVENOUS
  Filled 2017-10-21 (×3): qty 150

## 2017-10-21 MED ORDER — SODIUM CHLORIDE 0.9 % IV SOLN: Freq: Once | INTRAVENOUS | Status: DC

## 2017-10-21 MED ORDER — IBUPROFEN 100 MG/5ML PO SUSP
400.0000 mg | Freq: Four times a day (QID) | ORAL | Status: DC | PRN
  Administered 2017-10-21: 400 mg via ORAL
  Filled 2017-10-21 (×2): qty 20

## 2017-10-21 MED ORDER — SODIUM CHLORIDE 0.9 % IV BOLUS (SEPSIS)
1000.0000 mL | Freq: Once | INTRAVENOUS | Status: AC
  Administered 2017-10-21: 1000 mL via INTRAVENOUS

## 2017-10-21 MED ORDER — SODIUM CHLORIDE 0.9 % IV SOLN
1.5000 g | Freq: Four times a day (QID) | INTRAVENOUS | Status: DC
  Administered 2017-10-21 (×2): 1.5 g via INTRAVENOUS
  Filled 2017-10-21 (×5): qty 1.5

## 2017-10-21 MED ORDER — SODIUM CHLORIDE 0.9% FLUSH: 10.0000 mL | INTRAVENOUS | Status: DC | PRN

## 2017-10-21 MED ORDER — SODIUM CHLORIDE 0.9% FLUSH
10.0000 mL | Freq: Two times a day (BID) | INTRAVENOUS | Status: DC
  Administered 2017-10-21: 10 mL

## 2017-10-21 MED ORDER — POTASSIUM CHLORIDE 10 MEQ/50ML IV SOLN
10.0000 meq | INTRAVENOUS | Status: AC
  Administered 2017-10-21 (×4): 10 meq via INTRAVENOUS
  Filled 2017-10-21 (×4): qty 50

## 2017-10-21 MED ORDER — EPINEPHRINE PF 1 MG/10ML IJ SOSY
PREFILLED_SYRINGE | INTRAMUSCULAR | Status: AC
  Filled 2017-10-21: qty 20

## 2017-10-21 MED ORDER — CHLORHEXIDINE GLUCONATE 0.12% ORAL RINSE (MEDLINE KIT)
15.0000 mL | Freq: Two times a day (BID) | OROMUCOSAL | Status: DC
  Administered 2017-10-21: 15 mL via OROMUCOSAL

## 2017-10-21 MED FILL — Medication: Qty: 1 | Status: AC

## 2017-10-21 NOTE — Progress Notes (Signed)
Pt transported to CT while on vent and returned to ICU 2 without incident.

## 2017-10-21 NOTE — Progress Notes (Signed)
PULMONARY / CRITICAL CARE MEDICINE   Name: Faith Brown MRN: [id number] DOB: 1925/07/08    ADMISSION DATE:  11/03/17   CONSULTATION DATE:  10/21/2017  REFERRING MD:  Dr. Allena Katz  REASON: Acute GIB and acute blood loss anemia  HISTORY OF PRESENT ILLNESS:   This is a 84 year old African-American female, SNF resident, medical history as indicated below, who presented to the ED with acute change in mental status.  History is obtained from ED records as patient is currently intubated and sedated.  EMS was called by the nursing home staff because patient was minimally responsive.  At baseline, patient has some motor and verbal activity.  Today, nursing staff noted that she was not communicating at her baseline and her tongue and face with pale.  When EMS arrived, patient was hypoxic.  Upon arrival in the ED, patient was placed on BiPAP but subsequently failed BiPAP and was intubated.  Her ED labs showed a hemoglobin of 2.8, hematocrit of 9.6, platelets of 170, her potassium was 5.3, creatinine 1.68 up from her baseline of 2.88, calcium of 7.4 and a lactic acid of 9.38.  She is being admitted to the ICU for further management Once an NG was inserted, patient had coffee-ground output.  GI was consulted and she was started on a Protonix infusion.  Post intubation, patient developed hypovolemic shock necessitating pressors.  She is currently on epinephrine and vasopressin. Patient has a history of atrial fibrillation and CAD and was on digoxin, Plavix and aspirin   SUBJECTIVE: Remains on 3 pressors. Hemoglobin dropped post-transfusion. Minimal OGT output. No rectal bleeding noted since arrival in the ICU. Two units of PRBCs ordered for this morning. Patient received 3 units emergently in the ED.   VITAL SIGNS: BP (!) 88/74   Pulse 87   Temp 98.8 F (37.1 C) (Bladder)   Resp (!) 32   Wt 220 lb 7.4 oz (100 kg)   SpO2 99%   BMI 35.58 kg/m   HEMODYNAMICS:    VENTILATOR SETTINGS: Vent Mode:  PRVC FiO2 (%):  [40 %-100 %] 40 % Set Rate:  [18 bmp] 18 bmp Vt Set:  [500 mL] 500 mL PEEP:  [5 cmH20] 5 cmH20 Plateau Pressure:  [23 cmH20] 23 cmH20  INTAKE / OUTPUT: No intake/output data recorded.  PHYSICAL EXAMINATION: General: Chronically ill looking Neuro: Withdraws and opens eyes to pain, pupils are sluggish, gag reflex intact HEENT: Trachea midline, neck is supple with no JVD Cardiovascular: Apical pulse irregular, S1-S2, no murmur regurg or gallop, +2 pulses, +2 nonpitting edema Lungs: Bilateral breath sounds without any rhonchi or wheezes Abdomen: Nondistended, normal bowel sounds in all 4 quadrants, palpation reveals no organomegaly Musculoskeletal: Positive range of motion in upper and lower extremities no joint deformities patient is bed confined  Skin: Warm and dry, no rash or lesions, venous stasis discoloration in bilateral lower extremities  LABS:  BMET Recent Labs  Lab 11-03-17 1828 10/21/17 0530  NA 143 144  K 5.3* 3.3*  CL 110 116*  CO2 21* 20*  BUN 57* 52*  CREATININE 1.68* 1.40*  GLUCOSE 179* 155*    Electrolytes Recent Labs  Lab 03-Nov-2017 1828 11-03-17 2359 10/21/17 0530  CALCIUM 7.4*  --  6.7*  MG  --  1.9  --   PHOS  --  4.1  --     CBC Recent Labs  Lab 2017/11/03 1828 03-Nov-2017 2359 10/21/17 0530  WBC 3.8 13.1* 21.7*  HGB 2.8* 7.6* 6.7*  HCT 9.6* 23.3* 20.7*  PLT 170 167 139*    Coag's Recent Labs  Lab 11/03/2017 2359  INR 1.29    Sepsis Markers Recent Labs  Lab 10/21/2017 1828 10/26/2017 2014 10/16/2017 2359 10/21/17 0530  LATICACIDVEN 9.3* 6.7*  --   --   PROCALCITON  --   --  0.55 3.57    ABG Recent Labs  Lab 10/29/2017 1930  PHART 7.28*  PCO2ART 35  PO2ART 404*    Liver Enzymes Recent Labs  Lab 11/02/2017 1828  AST 26  ALT 11*  ALKPHOS 32*  BILITOT 0.8  ALBUMIN 2.1*    Cardiac Enzymes Recent Labs  Lab 10/19/2017 1828 10/11/2017 2006 11/05/2017 2359  TROPONINI <0.03 0.03* 0.13*    Glucose Recent Labs   Lab 11/07/2017 2123 10/21/17 0036 10/21/17 0433  GLUCAP 214* 272* 163*    Imaging Ct Head Wo Contrast  Result Date: 10/21/2017 CLINICAL DATA:  Acute onset of altered level of consciousness. Patient unresponsive. EXAM: CT HEAD WITHOUT CONTRAST TECHNIQUE: Contiguous axial images were obtained from the base of the skull through the vertex without intravenous contrast. COMPARISON:  CT of the head performed 02/02/2007, and MRI of the brain performed 02/05/2007 FINDINGS: Brain: No evidence of acute infarction, hemorrhage, hydrocephalus, extra-axial collection or mass lesion / mass effect. Prominence of the ventricles and sulci reflects mild cortical volume loss. Scattered periventricular and subcortical white matter change likely reflects small vessel ischemic microangiopathy. The brainstem and fourth ventricle are within normal limits. The basal ganglia are unremarkable in appearance. The cerebral hemispheres demonstrate grossly normal gray-white differentiation. No mass effect or midline shift is seen. Vascular: No hyperdense vessel or unexpected calcification. Skull: There is no evidence of fracture; visualized osseous structures are unremarkable in appearance. Sinuses/Orbits: The orbits are within normal limits. Mucosal thickening is noted at the right maxillary sinus. Paranasal sinuses and mastoid air cells are well-aerated. Other: No significant soft tissue abnormalities are seen. IMPRESSION: 1. No acute intracranial pathology seen on CT. 2. Mild cortical volume loss and scattered small vessel ischemic microangiopathy. 3. Mucosal thickening at the right maxillary sinus. Electronically Signed   By: Roanna RaiderJeffery  Chang M.D.   On: 10/21/2017 04:20   Dg Chest Port 1 View  Result Date: 11/03/2017 CLINICAL DATA:  Central line placement. EXAM: PORTABLE CHEST 1 VIEW COMPARISON:  October 20, 2017 FINDINGS: The ETT is in good position. A new left central line terminates in the central SVC. No pneumothorax. The distal  tip of the NG tube appears to be near the GE junction. This is stable. Cardiomegaly persists. The hila and mediastinum are unchanged. Mild opacity in the medial right lung base could represent vascular crowding and/or atelectasis. No suspicious infiltrate to suggest pneumonia. IMPRESSION: 1. New left central line terminates in the central SVC with no pneumothorax. The ETT is in good position. 2. The distal tip of the NG tube is near the GE junction. The patient may benefit from advancement. 3. No other acute abnormalities. Electronically Signed   By: Gerome Samavid  Williams III M.D   On: 10/17/2017 19:43   Dg Chest Portable 1 View  Result Date: 10/28/2017 CLINICAL DATA:  Status post intubation EXAM: PORTABLE CHEST 1 VIEW COMPARISON:  11/18/2009 FINDINGS: Cardiac shadow is mildly enlarged but stable. Aortic calcifications are again seen. Endotracheal tube is noted in satisfactory position. The nasogastric catheter extends into the distal esophagus but does not reach the stomach. This should be advanced and reimaged. The lungs are well aerated bilaterally. Minimal left basilar atelectatic changes are seen. No acute  bony abnormality is noted. IMPRESSION: Tubes and lines as described above. The endotracheal tube should be advanced and reimaged. Electronically Signed   By: Alcide Clever M.D.   On: 30-Oct-2017 19:00     STUDIES:  ECHO 06/17/2017 LV EF: 35% Severe LV systolic dysfunction with moderate MR/TR, and severe   pulmonary HTN. CULTURES: Cultures x2 Urine culture ANTIBIOTICS: Unasyn Vancomycin SIGNIFICANT EVENTS: 03/10: Admitted  LINES/TUBES: Left subclavian central venous catheter Foley catheter ETT PIVs  DISCUSSION: 84 year old female with multiple comorbidities presenting with acute GI bleed, acute hypoxic respiratory failure secondary to severe acute blood loss anemia, acute renal failure, severe lactic acidosis, hypokalemia and acute metabolic encephalopathy  ASSESSMENT  Septic and  hypovolemic shock Acute hypoxic respiratory failure Acute GI bleed-hemoglobin down to 2.8 from baseline of 8.3; looking at the current trend, patient's hemoglobin was 11.3 in September 2018 and dropped to 8.3 in November of last year and now 2.8 Severe lactic acidosis Leukocytosis Aspiration pneumonitis-potential risk for aspiration given acute change in mentation Acute metabolic encephalopathy-rule out CVA Acute blood loss anemia Atrial fibrillation Congestive heart failure Type 2 diabetes mellitus with renal complications Hyperlipidemia Hypertension  PLAN Hemodynamic monitoring per ICU protocol Decrease IV fluids to 102ml/hr Continue pressors to maintain mean arterial blood pressure 65 and above-currently on  epinephrine, vasopressin and norepinephrine infusions  Continue Unasyn and vancomycin for possible aspiration pneumonitis and intraabdominal infection Hold all antiplatelets and anticoagulants Hold all antihypertensives in light of shock Repeat 2D echo Transfuse blood products as ordered Trend hemoglobin and hematocrit Protonix infusion per GI Gastroenterology consulted and following CT head pending Monitor and replace electrolytes No pharmacologic DVT prophylaxis SCDs for DVT prophylaxis GI prophylaxis-already on a Protonix infusion  FAMILY  - Updates: Son updated by nursing.  Declined any updates by this Clinical research associate  - Inter-disciplinary family meet or Palliative Care meeting due by:  day 7   Kingslee Dowse S. [facility] ANP-BC Pulmonary and Critical Care Medicine [facility] Pager [phone] or [phone]  NB: This document was prepared using Dragon voice recognition software and may include unintentional dictation errors.    10/21/2017, 6:35 AM

## 2017-10-21 NOTE — Code Documentation (Signed)
Called to evaluate patient for worsening blood pressure despite being on maximum doses of 3 pressors and worsening acidosis.  Briefly, this is an 84 year old nursing home resident with multiple uncontrolled comorbid conditions who was admitted with acute change in mental status; was placed on BiPAP, failed and was subsequently intubated.  She was found to be severely anemic with a hemoglobin of 2.8.  She was seen refractory shock hence admitted to the ICU with severe lactic acidosis as well as acute renal failure.  Despite interventions in the ICU patient's condition continued to deteriorate.  GI was consulted and patient was not a candidate for any interventions.  She was transfused and started on pressors for shock.  Despite being on 3 pressors, patient remained in refractory shock.  She was placed on a Protonix infusion by gastroenterology.  The family was approached regarding patient's goals of care but this is on who is the responsible party indicated that he wanted to have a conversation with the patient's nieces and nephews prior to making a decision.  Unfortunately on 08-02-18 at about 9PM, patient's mean arterial blood pressure began to drop into the low 40s.  She was already on maximum doses of pressors hence Was Contacted regarding Goals of Care Given That Patient's Prognosis Was Poor.  Unfortunately despite Multiple Attempts we Were Unable to Contact the Son and at about patient made 2152, patient went into a PEA cardiac arrest.  CPR was initiated.  Between 2152 and 23:10, patient loss of pulse 4 times and was resuscitated.  She received a total of 6 doses of epinephrine, 6 doses of bicarb and 2 g of calcium. We reached out to [facility] and we were able to obtain a phone number for a family friend whom we called and requested that they should go and notified the patient's son of the patient's change in condition.   At about 12:30 AM 06-04-18, patient's son called and said he was on his way  in.  He arrived at the bedside and decided to make the patient full comfort care with no further aggressive interventions at 23:55.  Patient was terminally extubated and expired at 12:23 AM.  Candelaria StagersChaplin was paged and support was provided to the family. Dr. Lonn Georgiaonforti who was the covering attending as well as Elink when notified of patient's change in condition as well as change in goals of care  Magdalene S. [facility] ANP-BC Pulmonary and Critical Care Medicine [facility] Pager [phone] or [phone]  NB: This document was prepared using Dragon voice recognition software and may include unintentional dictation errors.

## 2017-10-21 NOTE — Progress Notes (Signed)
[employer] [facility] [facility] [place] [facility]   PATIENT NAME: Faith Brown    MR#:  [id number]  DATE OF BIRTH:  1924-09-27  SUBJECTIVE:  CHIEF COMPLAINT:   Chief Complaint  Patient presents with  . Altered Mental Status  . Respiratory Distress   Brought from facility with low Hb and AMS. Hb severely low, stabilized after transfusions. Also have low BP and need vasopressors.  REVIEW OF SYSTEMS:  Pt is intubated. Can not give ROS.  ROS  DRUG ALLERGIES:   Allergies  Allergen Reactions  . Lanolin Rash    VITALS:  Blood pressure 98/79, pulse (!) 178, temperature 99.1 F (37.3 C), temperature source Axillary, resp. rate (!) 31, weight 91.3 kg (201 lb 4.5 oz), SpO2 (!) 86 %.  PHYSICAL EXAMINATION:  GENERAL:  84 y.o.-year-old patient lying in the bed critical appearing. EYES: Pupils equal, round, reactive to light and accommodation. No scleral icterus. Extraocular muscles intact.  HEENT: Head atraumatic, normocephalic. Oropharynx and nasopharynx clear. ETT in place. NECK:  Supple, no jugular venous distention. No thyroid enlargement, no tenderness.  LUNGS: Normal breath sounds bilaterally, no wheezing, rales,rhonchi or crepitation. No use of accessory muscles of respiration. On vent support. CARDIOVASCULAR: S1, S2 normal. No murmurs, rubs, or gallops.  ABDOMEN: Soft, nontender, nondistended. Bowel sounds present. No organomegaly or mass.  EXTREMITIES: No pedal edema, cyanosis, or clubbing.  NEUROLOGIC: Pt is on vent support, sedated.  PSYCHIATRIC: The patient is sedated. SKIN: No obvious rash, lesion, or ulcer.   Physical Exam LABORATORY PANEL:   CBC Recent Labs  Lab 10/21/17 0530 10/21/17 1733  WBC 21.7*  --   HGB 6.7* 8.8*  HCT 20.7*  --   PLT 139*  --    ------------------------------------------------------------------------------------------------------------------  Chemistries  Recent Labs  Lab 10-25-2017 1828  10/21/17 0530 10/21/17 0811  NA 143   --  144  --   K 5.3*  --  3.3*  --   CL 110  --  116*  --   CO2 21*  --  20*  --   GLUCOSE 179*  --  155*  --   BUN 57*  --  52*  --   CREATININE 1.68*  --  1.40*  --   CALCIUM 7.4*  --  6.7*  --   MG  --    < >  --  1.5*  AST 26  --   --   --   ALT 11*  --   --   --   ALKPHOS 32*  --   --   --   BILITOT 0.8  --   --   --    < > = values in this interval not displayed.   ------------------------------------------------------------------------------------------------------------------  Cardiac Enzymes Recent Labs  Lab 2017/10/25 2359 10/21/17 0811  TROPONINI 0.13* 0.48*   ------------------------------------------------------------------------------------------------------------------  RADIOLOGY:  Dg Abd 1 View  Result Date: 10/21/2017 CLINICAL DATA:  Orogastric tube placement. EXAM: ABDOMEN - 1 VIEW COMPARISON:  None. FINDINGS: Tube enters the stomach and is present within the mid body. Large amount of fecal matter is noted within the colon. No sign of bowel obstruction. IMPRESSION: Soft feeding tube tip in the mid stomach. Electronically Signed   By: Paulina Fusi M.D.   On: 10/21/2017 15:07   Ct Head Wo Contrast  Result Date: 10/21/2017 CLINICAL DATA:  Acute onset of altered level of consciousness. Patient unresponsive. EXAM: CT HEAD WITHOUT CONTRAST TECHNIQUE: Contiguous axial images were obtained from the base of the  skull through the vertex without intravenous contrast. COMPARISON:  CT of the head performed 02/02/2007, and MRI of the brain performed 02/05/2007 FINDINGS: Brain: No evidence of acute infarction, hemorrhage, hydrocephalus, extra-axial collection or mass lesion / mass effect. Prominence of the ventricles and sulci reflects mild cortical volume loss. Scattered periventricular and subcortical white matter change likely reflects small vessel ischemic microangiopathy. The brainstem and fourth ventricle are within normal limits. The basal ganglia are unremarkable in  appearance. The cerebral hemispheres demonstrate grossly normal gray-white differentiation. No mass effect or midline shift is seen. Vascular: No hyperdense vessel or unexpected calcification. Skull: There is no evidence of fracture; visualized osseous structures are unremarkable in appearance. Sinuses/Orbits: The orbits are within normal limits. Mucosal thickening is noted at the right maxillary sinus. Paranasal sinuses and mastoid air cells are well-aerated. Other: No significant soft tissue abnormalities are seen. IMPRESSION: 1. No acute intracranial pathology seen on CT. 2. Mild cortical volume loss and scattered small vessel ischemic microangiopathy. 3. Mucosal thickening at the right maxillary sinus. Electronically Signed   By: Roanna RaiderJeffery  Chang M.D.   On: 10/21/2017 04:20   Portable Chest Xray  Result Date: 10/21/2017 CLINICAL DATA:  Respiratory failure. EXAM: PORTABLE CHEST 1 VIEW COMPARISON:  11/10/2017 and CT chest 01/30/2007. FINDINGS: Endotracheal tube terminates 3.6 cm above the carina. Nasogastric tube may be coiled in the hypopharynx with the side port in the lower esophagus. Left subclavian central line tip projects over the SVC. Defibrillator pads project over the left lower chest. Heart is enlarged, stable. Thoracic aorta is calcified. Mild interstitial prominence and indistinctness, slightly improved from yesterday. Mild lucent appearance of the upper lung zones is indicative of emphysema, as on 01/30/2007. There may be left lower lobe consolidation and a left pleural effusion. IMPRESSION: 1. Possible left lower lobe consolidation and left pleural effusion. 2. Favor improving edema. 3.  Aortic atherosclerosis (ICD10-170.0). Electronically Signed   By: Leanna BattlesMelinda  Blietz M.D.   On: 10/21/2017 07:46   Dg Chest Port 1 View  Result Date: 10/25/2017 CLINICAL DATA:  Central line placement. EXAM: PORTABLE CHEST 1 VIEW COMPARISON:  October 20, 2017 FINDINGS: The ETT is in good position. A new left central  line terminates in the central SVC. No pneumothorax. The distal tip of the NG tube appears to be near the GE junction. This is stable. Cardiomegaly persists. The hila and mediastinum are unchanged. Mild opacity in the medial right lung base could represent vascular crowding and/or atelectasis. No suspicious infiltrate to suggest pneumonia. IMPRESSION: 1. New left central line terminates in the central SVC with no pneumothorax. The ETT is in good position. 2. The distal tip of the NG tube is near the GE junction. The patient may benefit from advancement. 3. No other acute abnormalities. Electronically Signed   By: Gerome Samavid  Williams III M.D   On: 11/10/2017 19:43   Dg Chest Portable 1 View  Result Date: 10/17/2017 CLINICAL DATA:  Status post intubation EXAM: PORTABLE CHEST 1 VIEW COMPARISON:  11/18/2009 FINDINGS: Cardiac shadow is mildly enlarged but stable. Aortic calcifications are again seen. Endotracheal tube is noted in satisfactory position. The nasogastric catheter extends into the distal esophagus but does not reach the stomach. This should be advanced and reimaged. The lungs are well aerated bilaterally. Minimal left basilar atelectatic changes are seen. No acute bony abnormality is noted. IMPRESSION: Tubes and lines as described above. The endotracheal tube should be advanced and reimaged. Electronically Signed   By: Alcide CleverMark  Lukens M.D.   On: 11/05/2017  19:00    ASSESSMENT AND PLAN:   Active Problems:   Acute encephalopathy  The patient is a 84 year old who is presenting to the emergency room with decreased responsiveness  1.  Anemia due to GI bleed on Protonix GI consult appreciated.  Not stable for procedure.   S/p blood transfusion, improved. Monitor.  2.  Acute respiratory failure continue ventilator Further therapy per the intensivist  3.  Hypotension related to hypovolemic shock aggressive IV fluids, pressors  4.  Diabetes type 2 Place on sliding scale insulin check blood  sugars every 6 hours  5.  History of congestive heart failure monitor fluid status  6. Suspected infection   Not clear source   May be aspiration   On abx, cx sent.  7. Elevated troponin   May be due to stress. Monitor.     All the records are reviewed and case discussed with Care Management/Social Workerr. Management plans discussed with the patient, family and they are in agreement.  CODE STATUS: full.  TOTAL TIME TAKING CARE OF THIS PATIENT: 35 minutes.     POSSIBLE D/C IN 1-2 DAYS, DEPENDING ON CLINICAL CONDITION.   Altamese Dilling M.D on 10/21/2017   Between 7am to 6pm - Pager - [phone]  After 6pm go to [email or web address] - password [email or web address]  [facility]  Office  [phone]  CC: Primary care physician; Nicholaus Corolla, MD  Note: This dictation was prepared with Dragon dictation along with smaller phrase technology. Any transcriptional errors that result from this process are unintentional.

## 2017-10-21 NOTE — Progress Notes (Signed)
Weaned of patient's epinephrine drip- for about 4 hrs- now patient back on epi at 15mcqs, Levophed at 50mcqs and vasopressin infusing.  Pt received 2 units of PRBC- waiting on repeat hgb at this time.  Pt had 1 medium soft black stool.  Pt has been having an increased fever during shift- I have given tylenol which did not help and then motrin.  Patient temp at this time is 101.8. Blood sugar dropped to 43- D50 given- Oxygen head probe not reading well on monitor- repeat ABG was ordered and fiO2 increased to 100%. Notified Dr. Lonn Georgiaonforti of these updates- he ordered to receive a urine culture since blood cultures were drawn last night.

## 2017-10-21 NOTE — Progress Notes (Signed)
Pharmacy Electrolyte Monitoring Consult:  Pharmacy consulted to assist in monitoring and replacing electrolytes in this 84 y.o. female admitted on 11/08/2017 with Altered Mental Status and Respiratory Distress   Labs:  Sodium (mmol/L)  Date Value  10/21/2017 144   Potassium (mmol/L)  Date Value  10/21/2017 3.3 (L)   Magnesium (mg/dL)  Date Value  16/10/960403/06/2018 1.5 (L)   Phosphorus (mg/dL)  Date Value  54/09/811903/06/2018 2.9   Calcium (mg/dL)  Date Value  14/78/295603/06/2018 6.7 (L)   Albumin (g/dL)  Date Value  21/30/865703/05/2018 2.1 (L)    Plan: K=3.3, Mg=1.5. K 10mEq x4 IV and Mg 4g IV once.Will continue to monitor and replace electrolytes as needed.   Cleopatra CedarStephanie Jarryd Gratz, PharmD Pharmacy Resident  10/21/2017 10:56 AM

## 2017-10-21 NOTE — Progress Notes (Signed)
RT and CCU RNx2 transports pts to CT for head CT

## 2017-10-21 NOTE — Progress Notes (Signed)
   10/21/17 2215  Clinical Encounter Type  Visited With Patient not available  Visit Type Code  Referral From Nurse  Consult/Referral To Chaplain  Spiritual Encounters  Spiritual Needs Emotional   CH received another Code Blue for Patient. CH provide support for staff and ministry of presences.

## 2017-10-21 NOTE — Progress Notes (Signed)
Initial Nutrition Assessment  DOCUMENTATION CODES:   Obesity unspecified  INTERVENTION:  Patient is not stable enough for enteral nutrition at this time.  Once patient is volume resuscitated, hemodynamically stable, okay to feed from a GI perspective, and OGT is confirmed to be in appropriate position, recommend initiating Vital High Protein at 30 mL/hr (720 mL goal daily volume) + Pro-Stat 60 mL BID. Provides 1120 kcal, 123 grams of protein, 605 mL H2O daily. With current propofol rate provides 1278 kcal daily.  If tube feeds are initiated also provide liquid MVI daily per tube.  NUTRITION DIAGNOSIS:   Inadequate oral intake related to inability to eat as evidenced by NPO status.  GOAL:   Provide needs based on ASPEN/SCCM guidelines  MONITOR:   Vent status, Labs, Weight trends, TF tolerance, I & O's  REASON FOR ASSESSMENT:   Ventilator    ASSESSMENT:   84 year old female with PMHx of HTN, CHF, HLD, DM type 2, A-fib, CAD s/p CABG who presented from [facility] with AMS, was emergently intubated on 3/10, found to have GI bleed and hypovolemic shock requiring transfusions and pressors.   -Pending GI consult. -Pending palliative medicine consult to discuss goals of care.  Patient intubated and sedated. No family members present at time of RD assessment. Per white board in room patient is 91.3 kg. That is consistent with weight of 91.6 kg on 06/19/2017.  Access: 18 Fr. OGT placed 3/10; per chest x-ray 3/11 tube is coiled in hypopharynx and side port is in lower esophagus; 74 cm at corner of mouth; plan is to replace OGT so it is in correct position  MAP: 48-92 mmHg; pt on 3 pressors and actively receiving transfusions  Patient is currently intubated on ventilator support MV: 12.8 L/min Temp (24hrs), Avg:96.3 F (35.7 C), Min:93.9 F (34.4 C), Max:100.2 F (37.9 C)  Propofol: 6 ml/hr (158 kcal daily)  Medications reviewed and include: Novolog 0-15 units Q4hrs,  pantoprazole, NS @ 50 mL/hr, Unasyn, epinephrine gtt at 10 mcg/min, magnesium sulfate 4 grams IV once today, norepinephrine gtt at 15 mcg/min, pantoprazole, propofol gtt, vasopressin gtt at 0.4 units/min.  Labs reviewed: CBG 92-163, Potassium 3.3, Chloride 116, CO2 20, BUN 52, Creatinine 1.4, Phosphorus 2.9, Magnesium 1.5, Hgb 6.7 (trending up from 2.8 last night).  Patient does not meet criteria for malnutrition at this time.  Discussed with RN and on rounds. Patient will be getting 2 units pRBCs today.  NUTRITION - FOCUSED PHYSICAL EXAM:    Most Recent Value  Orbital Region  No depletion  Upper Arm Region  No depletion  Thoracic and Lumbar Region  No depletion  Buccal Region  Unable to assess  Temple Region  Mild depletion  Clavicle Bone Region  No depletion  Clavicle and Acromion Bone Region  No depletion  Scapular Bone Region  Unable to assess  Dorsal Hand  No depletion  Patellar Region  No depletion  Anterior Thigh Region  No depletion  Posterior Calf Region  Mild depletion  Edema (RD Assessment)  None  Hair  Reviewed  Eyes  Unable to assess  Mouth  Reviewed [poor dentition noted even with ETT in]  Skin  Reviewed [tenting]  Nails  Reviewed     Diet Order:  Diet NPO time specified  EDUCATION NEEDS:   No education needs have been identified at this time  Skin:  Skin Assessment: Reviewed RN Assessment  Last BM:  Unknown  Height:   Ht Readings from Last 1 Encounters:  06/17/17 5\' 6"  (1.676 m)    Weight:   Wt Readings from Last 1 Encounters:  10/21/17 201 lb 4.5 oz (91.3 kg)    Ideal Body Weight:  59.1 kg(calculated with ht of 5\' 6"  from previous encounter)  BMI:  Body mass index is 32.49 kg/m.  Estimated Nutritional Needs:   Kcal:  [id number] (11-14 kcal/kg)  Protein:  >/= 118 grams (>/= 2 grams/kg IBW)  Fluid:  1.5 L/day (25 mL/kg IBW)  Helane RimaLeanne Shadai Mcclane, MS, RD, LDN Office: [phone] Pager: [phone] After Hours/Weekend Pager:  [phone]

## 2017-10-21 NOTE — Consult Note (Signed)
Midge Minium, MD [facility]  [address] [place], [place] [address] Phone: [phone] Fax : [phone]  Consultation  Referring Provider:     Dr. Allena Katz Primary Care Physician:  Nicholaus Corolla, MD Primary Gastroenterologist:       Gentry Fitz    Reason for Consultation:     Profound anemia  Date of Admission:  2017-11-05 Date of Consultation:  10/21/2017         HPI:   Faith Brown is a 84 y.o. female with a history of atrial fibrillation and home meds include Plavix.  The patient had been admitted with decreased responsiveness and was brought to emergency room.  The patient was noted to be pale and was put on BiPAP in the ER.  The patient then had blood work that showed her hemoglobin to be 2.8.  The patient's blood count back in January 2017 was 12.5 and had gone down to 11.3 in September 2018.  2 months later in November the hemoglobin went down to 9.0 and subsequent to that 8.3.  The patient was transfused 3 units of blood and had a hemoglobin of 6.7 today.  The patient has been intubated since her visit to the ER.  She is not able to give any history.  Despite this profound anemia the patient's MCV is normal.  Patient's INR was also prolonged at 1.29.  On admission the patient troponin was 0.13 which has increased to 0.48.  The patient had a NG tube placed with a small amount of blood from the NG.  The patient was started on Protonix vasopressin and epinephrine.  I am now being asked to see the patient for profound anemia  Past Medical History:  Diagnosis Date  . Atrial fibrillation (HCC)   . CHF (congestive heart failure) (HCC)   . Diabetes mellitus without complication (HCC)   . Hyperlipemia   . Hypertension   . Shortness of breath dyspnea     Past Surgical History:  Procedure Laterality Date  . ABDOMINAL HYSTERECTOMY    . CORONARY ARTERY BYPASS GRAFT      Prior to Admission medications   Medication Sig Start Date End Date Taking? Authorizing Provider  amiodarone  (PACERONE) 200 MG tablet Take 2 tablets (400 mg total) 2 (two) times daily by mouth. After 1 week start 200 mg bid Patient taking differently: Take 50 mg by mouth 2 (two) times daily.  06/19/17  Yes Enedina Finner, MD  arformoterol (BROVANA) 15 MCG/2ML NEBU Take 15 mcg by nebulization 2 (two) times daily.   Yes [provider]  aspirin (ASPIRIN CHILDRENS) 81 MG chewable tablet Chew 1 tablet (81 mg total) by mouth daily. 04/26/17  Yes Emily Filbert, MD  atorvastatin (LIPITOR) 20 MG tablet Take 1 tablet (20 mg total) by mouth daily. Patient taking differently: Take 20 mg by mouth every other day.  04/26/17  Yes Emily Filbert, MD  busPIRone (BUSPAR) 10 MG tablet Take 10 mg by mouth 2 (two) times daily.   Yes [provider]  carvedilol (COREG) 6.25 MG tablet Take 1 tablet (6.25 mg total) 2 (two) times daily with a meal by mouth. Patient taking differently: Take 3.125 mg by mouth 2 (two) times daily with a meal.  06/19/17  Yes Enedina Finner, MD  clopidogrel (PLAVIX) 75 MG tablet Take 75 mg by mouth daily.   Yes [provider]  ipratropium-albuterol (DUONEB) 0.5-2.5 (3) MG/3ML SOLN Take 3 mLs by nebulization every 8 (eight) hours as needed (cough).  Yes [provider]  isosorbide mononitrate (IMDUR) 30 MG 24 hr tablet Take 30 mg by mouth daily.   Yes [provider]  nitroGLYCERIN (NITROSTAT) 0.4 MG SL tablet Place 1 tablet (0.4 mg total) under the tongue every 5 (five) minutes as needed for chest pain. 04/26/17  Yes Emily Filbert, MD  sacubitril-valsartan (ENTRESTO) 24-26 MG Take 0.5 tablets by mouth 2 (two) times daily.   Yes [provider]  spironolactone (ALDACTONE) 25 MG tablet Take 12.5 mg by mouth daily.   Yes [provider]  torsemide (DEMADEX) 20 MG tablet Take 1 tablet (20 mg total) by mouth daily. 04/26/17  Yes Emily Filbert, MD  digoxin (LANOXIN) 0.125 MG tablet Take 1 tablet (0.125 mg total) daily by  mouth. Patient not taking: Reported on 10-30-17 06/20/17   Enedina Finner, MD    Family History  Family history unknown: Yes     Social History   Tobacco Use  . Smoking status: Never Smoker  . Smokeless tobacco: Never Used  Substance Use Topics  . Alcohol use: No  . Drug use: Not on file    Allergies as of 30-Oct-2017 - Review Complete 10/30/2017  Allergen Reaction Noted  . Lanolin Rash 06/17/2017    Review of Systems:    All systems reviewed and negative except where noted in HPI.   Physical Exam:  Vital signs in last 24 hours: Temp:  [93.9 F (34.4 C)-100.9 F (38.3 C)] 100.9 F (38.3 C) (03/11 1345) Pulse Rate:  [42-103] 103 (03/11 1345) Resp:  [0-41] 28 (03/11 1345) BP: (40-147)/(23-96) 87/76 (03/11 1345) SpO2:  [74 %-100 %] 94 % (03/11 1120) FiO2 (%):  [30 %-100 %] 30 % (03/11 1345) Weight:  [201 lb 4.5 oz (91.3 kg)-220 lb 7.4 oz (100 kg)] 201 lb 4.5 oz (91.3 kg) (03/11 1302)   General:   Intubated and sedated Head:  Normocephalic and atraumatic. Eyes:   No icterus.   Conjunctiva pale.  Ears: Unable to test. Neck:  Supple; no masses or thyroidomegaly Lungs: Respirations even and unlabored. Lungs clear to auscultation bilaterally.   No wheezes, crackles, or rhonchi.  Heart:  Regular rate and rhythm;  Without murmur, clicks, rubs or gallops Abdomen:  Soft, nondistended, nontender. Normal bowel sounds. No appreciable masses or hepatomegaly.  No rebound or guarding.  Rectal:  Not performed. Msk:  Symmetrical without gross deformities.    Extremities:  Without edema, cyanosis or clubbing. Neurologic: Sedated and unable to assess Skin:  Intact without significant lesions or rashes. Cervical Nodes:  No significant cervical adenopathy. Psych: Patient intubated and sedated.  LAB RESULTS: Recent Labs    October 30, 2017 1828 30-Oct-2017 2359 10/21/17 0530  WBC 3.8 13.1* 21.7*  HGB 2.8* 7.6* 6.7*  HCT 9.6* 23.3* 20.7*  PLT 170 167 139*   BMET Recent Labs     10-30-2017 1828 10/21/17 0530  NA 143 144  K 5.3* 3.3*  CL 110 116*  CO2 21* 20*  GLUCOSE 179* 155*  BUN 57* 52*  CREATININE 1.68* 1.40*  CALCIUM 7.4* 6.7*   LFT Recent Labs    2017-10-30 1828  PROT 4.9*  ALBUMIN 2.1*  AST 26  ALT 11*  ALKPHOS 32*  BILITOT 0.8   PT/INR Recent Labs    10-30-17 2359  LABPROT 16.0*  INR 1.29    STUDIES: Ct Head Wo Contrast  Result Date: 10/21/2017 CLINICAL DATA:  Acute onset of altered level of consciousness. Patient unresponsive. EXAM: CT HEAD WITHOUT CONTRAST TECHNIQUE: Contiguous  axial images were obtained from the base of the skull through the vertex without intravenous contrast. COMPARISON:  CT of the head performed 02/02/2007, and MRI of the brain performed 02/05/2007 FINDINGS: Brain: No evidence of acute infarction, hemorrhage, hydrocephalus, extra-axial collection or mass lesion / mass effect. Prominence of the ventricles and sulci reflects mild cortical volume loss. Scattered periventricular and subcortical white matter change likely reflects small vessel ischemic microangiopathy. The brainstem and fourth ventricle are within normal limits. The basal ganglia are unremarkable in appearance. The cerebral hemispheres demonstrate grossly normal gray-white differentiation. No mass effect or midline shift is seen. Vascular: No hyperdense vessel or unexpected calcification. Skull: There is no evidence of fracture; visualized osseous structures are unremarkable in appearance. Sinuses/Orbits: The orbits are within normal limits. Mucosal thickening is noted at the right maxillary sinus. Paranasal sinuses and mastoid air cells are well-aerated. Other: No significant soft tissue abnormalities are seen. IMPRESSION: 1. No acute intracranial pathology seen on CT. 2. Mild cortical volume loss and scattered small vessel ischemic microangiopathy. 3. Mucosal thickening at the right maxillary sinus. Electronically Signed   By: Roanna RaiderJeffery  Chang M.D.   On: 10/21/2017  04:20   Portable Chest Xray  Result Date: 10/21/2017 CLINICAL DATA:  Respiratory failure. EXAM: PORTABLE CHEST 1 VIEW COMPARISON:  10/30/2017 and CT chest 01/30/2007. FINDINGS: Endotracheal tube terminates 3.6 cm above the carina. Nasogastric tube may be coiled in the hypopharynx with the side port in the lower esophagus. Left subclavian central line tip projects over the SVC. Defibrillator pads project over the left lower chest. Heart is enlarged, stable. Thoracic aorta is calcified. Mild interstitial prominence and indistinctness, slightly improved from yesterday. Mild lucent appearance of the upper lung zones is indicative of emphysema, as on 01/30/2007. There may be left lower lobe consolidation and a left pleural effusion. IMPRESSION: 1. Possible left lower lobe consolidation and left pleural effusion. 2. Favor improving edema. 3.  Aortic atherosclerosis (ICD10-170.0). Electronically Signed   By: Leanna BattlesMelinda  Blietz M.D.   On: 10/21/2017 07:46   Dg Chest Port 1 View  Result Date: 10/17/2017 CLINICAL DATA:  Central line placement. EXAM: PORTABLE CHEST 1 VIEW COMPARISON:  October 20, 2017 FINDINGS: The ETT is in good position. A new left central line terminates in the central SVC. No pneumothorax. The distal tip of the NG tube appears to be near the GE junction. This is stable. Cardiomegaly persists. The hila and mediastinum are unchanged. Mild opacity in the medial right lung base could represent vascular crowding and/or atelectasis. No suspicious infiltrate to suggest pneumonia. IMPRESSION: 1. New left central line terminates in the central SVC with no pneumothorax. The ETT is in good position. 2. The distal tip of the NG tube is near the GE junction. The patient may benefit from advancement. 3. No other acute abnormalities. Electronically Signed   By: Gerome Samavid  Williams III M.D   On: 11/01/2017 19:43   Dg Chest Portable 1 View  Result Date: 11/09/2017 CLINICAL DATA:  Status post intubation EXAM: PORTABLE  CHEST 1 VIEW COMPARISON:  11/18/2009 FINDINGS: Cardiac shadow is mildly enlarged but stable. Aortic calcifications are again seen. Endotracheal tube is noted in satisfactory position. The nasogastric catheter extends into the distal esophagus but does not reach the stomach. This should be advanced and reimaged. The lungs are well aerated bilaterally. Minimal left basilar atelectatic changes are seen. No acute bony abnormality is noted. IMPRESSION: Tubes and lines as described above. The endotracheal tube should be advanced and reimaged. Electronically Signed  By: Alcide Clever M.D.   On: 10/31/2017 19:00      Impression / Plan:   Faith Brown is a 84 y.o. y/o female with profound anemia which has responded well to transfusions.  The patient likely has had a long bleeding with her hemoglobin going down since September 2018.  I have spoken to the family about how aggressive they want [place] to be in treating the patient.  They have responded that they would like to go slowly and not do anything too invasive since she is quite sick at the present time.  The family has been told that with her low hemoglobin and increased troponin, I would agree to transfuse her as needed and wait for her to improve prior to doing any endoscopic procedures.  This patient is a high risk for any endoscopic intervention at this time.  The patient family has been explained the plan and agree with it.  Thank you for involving me in the care of this patient.      LOS: 1 day   Midge Minium, MD  10/21/2017, 2:25 PM   Note: This dictation was prepared with Dragon dictation along with smaller phrase technology. Any transcriptional errors that result from this process are unintentional.

## 2017-10-21 NOTE — Progress Notes (Signed)
   10/21/17 2155  Clinical Encounter Type  Visited With Patient not available  Visit Type Code  Referral From Nurse  Consult/Referral To Chaplain  Spiritual Encounters  Spiritual Needs Other (Comment)   CH received a Code Blue for PT. No family was present. CH offered ministry of presences.

## 2017-10-21 NOTE — Progress Notes (Signed)
Abg d/ced by provider

## 2017-10-21 NOTE — Progress Notes (Signed)
   10/21/17 2255  Clinical Encounter Type  Visited With Patient not available  Visit Type Code  Referral From Nurse  Consult/Referral To Chaplain  Spiritual Encounters  Spiritual Needs Emotional   CH responded to the third Code Blue for the PT. CH offered emotional support for the staff, no family can be reached.

## 2017-10-21 NOTE — Progress Notes (Addendum)
Pt normothermic bair hugger removed.

## 2017-10-21 NOTE — Progress Notes (Signed)
*  PRELIMINARY RESULTS* Echocardiogram 2D Echocardiogram has been performed.  Faith Brown, Faith Brown 10/21/2017, 2:36 PM

## 2017-10-21 NOTE — Progress Notes (Signed)
   10/21/17 1050  Clinical Encounter Type  Visited With Patient  Visit Type Initial  Spiritual Encounters  Spiritual Needs Prayer   Chaplain offered prayers for patient.

## 2017-10-21 NOTE — Progress Notes (Signed)
   10/21/17 2310  Clinical Encounter Type  Visited With Patient not available  Visit Type Code  Referral From Nurse  Consult/Referral To Chaplain  Spiritual Encounters  Spiritual Needs Emotional;Grief support   CH received 4th Code Blue for this PT. CH attempted to encourage staff as the process is starting to wear on them. Family was not reached until after PT was revived. Family is en-route. CH will follow up with Family.

## 2017-10-22 LAB — BPAM RBC
BLOOD PRODUCT EXPIRATION DATE: 201903252359
BLOOD PRODUCT EXPIRATION DATE: 201904042359
BLOOD PRODUCT EXPIRATION DATE: 201904042359
Blood Product Expiration Date: 201903252359
Blood Product Expiration Date: 201903282359
Blood Product Expiration Date: 201904022359
Blood Product Expiration Date: 201904042359
ISSUE DATE / TIME: 201903101851
ISSUE DATE / TIME: 201903111344
ISSUE DATE / TIME: 201903120929
ISSUE DATE / TIME: 201903101900
ISSUE DATE / TIME: 201903101900
ISSUE DATE / TIME: 201903110853
Unit Type and Rh: 5100
Unit Type and Rh: 5100
Unit Type and Rh: 5100
Unit Type and Rh: 5100
Unit Type and Rh: 5100
Unit Type and Rh: 5100
Unit Type and Rh: 5100

## 2017-10-22 LAB — TYPE AND SCREEN
ABO/RH(D): O POS
ANTIBODY SCREEN: NEGATIVE
UNIT DIVISION: 0
UNIT DIVISION: 0
UNIT DIVISION: 0
Unit division: 0
Unit division: 0
Unit division: 0
Unit division: 0

## 2017-10-22 LAB — PREPARE RBC (CROSSMATCH)

## 2017-10-22 LAB — HEMOGLOBIN: HEMOGLOBIN: 7.2 g/dL — AB (ref 12.0–16.0)

## 2017-10-22 MED ORDER — MORPHINE BOLUS VIA INFUSION
5.0000 mg | INTRAVENOUS | Status: DC | PRN
  Filled 2017-10-22: qty 20

## 2017-10-22 MED FILL — Medication: Qty: 1 | Status: AC

## 2017-10-23 LAB — PREPARE RBC (CROSSMATCH)

## 2017-10-23 LAB — URINE CULTURE: Culture: NO GROWTH

## 2017-10-25 LAB — CULTURE, BLOOD (ROUTINE X 2)
Culture: NO GROWTH
Culture: NO GROWTH
SPECIAL REQUESTS: ADEQUATE
Special Requests: ADEQUATE

## 2017-10-29 ENCOUNTER — Telehealth: Payer: Self-pay

## 2017-10-29 NOTE — Telephone Encounter (Signed)
Death certificate placed in MD folder for completion. 

## 2017-10-29 NOTE — Telephone Encounter (Signed)
Informed funeral home death cert is ready for pick up. 

## 2017-10-29 NOTE — Telephone Encounter (Signed)
[employer] dropped off Death Certificate to be completed and signed Place in Nurse Box

## 2017-10-30 LAB — BLOOD GAS, ARTERIAL
Acid-base deficit: 13.7 mmol/L — ABNORMAL HIGH (ref 0.0–2.0)
Bicarbonate: 15.6 mmol/L — ABNORMAL LOW (ref 20.0–28.0)
FIO2: 100
MECHVT: 500 mL
Mechanical Rate: 18
O2 Saturation: 75.6 %
PATIENT TEMPERATURE: 37
PEEP/CPAP: 5 cmH2O
pCO2 arterial: 54 mmHg — ABNORMAL HIGH (ref 32.0–48.0)
pH, Arterial: 7.07 — CL (ref 7.350–7.450)
pO2, Arterial: 58 mmHg — ABNORMAL LOW (ref 83.0–108.0)

## 2017-11-11 NOTE — Progress Notes (Signed)
Patient code four times tonight. Loss pulse during every code and regain ROSC each time. Family was called multiple times and messages was left regarding patient's condition. After fourth code family was able to be reached and came in for family visitation. NP discuss with family regarding code status and outcomes. Family decided to make patient DNR/Comfort care. Patient was extubated at 12:14 am and was deceased at 12:23 am.

## 2017-11-11 NOTE — Progress Notes (Signed)
   10/13/2017 0015  Clinical Encounter Type  Visited With Family  Visit Type Death;Patient actively dying  Referral From Nurse  Consult/Referral To Chaplain  Spiritual Encounters  Spiritual Needs Emotional;Grief support   CH received an OR to report to PT's RM for end of life ministry. CH meet family and offered grief and emotional support. CH also talked with staff to help them process a difficult case.

## 2017-11-11 NOTE — Death Summary Note (Signed)
DEATH SUMMARY   Patient Details  Name: Faith Brown MRN: [id number] DOB: 03/21/1925  Admission/Discharge Information   Admit Date:  10/16/2017  Date of Death: Date of Death: 05-30-18  Time of Death: Time of Death: 0023  Length of Stay: 2  Referring Physician: Nicholaus CorollaKhan, Farah, MD   Reason(s) for Hospitalization  cardiac arrest Septic and hypovolemic shock Acute hypoxic respiratory failure Acute GI bleed-hemoglobin down to 2.8 from baseline of 8.3; looking at the current trend, patient's hemoglobin was 11.3 in September 2018 and dropped to 8.3 in November of last year and now 2.8 Severe lactic acidosis Leukocytosis Aspiration pneumonitis-potential risk for aspiration given acute change in mentation Acute metabolic encephalopathy-rule out CVA Acute blood loss anemia Atrial fibrillation Congestive heart failure Type 2 diabetes mellitus with renal complications Hyperlipidemia Hypertension   Diagnoses  Preliminary cause of death: Cardiac arrest Secondary Diagnoses (including complications and co-morbidities):  Active Problems:   Acute encephalopathy cardiac arrest Septic and hypovolemic shock Acute hypoxic respiratory failure Acute GI bleed-hemoglobin down to 2.8 from baseline of 8.3; looking at the current trend, patient's hemoglobin was 11.3 in September 2018 and dropped to 8.3 in November of last year and now 2.8 Severe lactic acidosis Leukocytosis Aspiration pneumonitis-potential risk for aspiration given acute change in mentation Acute metabolic encephalopathy-rule out CVA Acute blood loss anemia Atrial fibrillation Congestive heart failure Type 2 diabetes mellitus with renal complications Hyperlipidemia Hypertension   Brief Hospital Course (including significant findings, care, treatment, and services provided and events leading to death)  Faith Brown is a 84 y.o. year old female who admitted with acute change in mental status; was placed on BiPAP, failed and was  subsequently intubated.  She was found to be severely anemic with a hemoglobin of 2.8.  She was seen refractory shock hence admitted to the ICU with severe lactic acidosis as well as acute renal failure.  Despite interventions in the ICU patient's condition continued to deteriorate.  GI was consulted and patient was not a candidate for any interventions.  She was transfused and started on pressors for shock.  Despite being on 3 pressors, patient remained in refractory shock.  She was placed on a Protonix infusion by gastroenterology.  The family was approached regarding patient's goals of care but this is on who is the responsible party indicated that he wanted to have a conversation with the patient's nieces and nephews prior to making a decision.  Unfortunately on July 06, 2018 at about 9PM, patient's mean arterial blood pressure began to drop into the low 40s.  She was already on maximum doses of pressors hence Was Contacted regarding Goals of Care Given That Patient's Prognosis Was Poor.  Unfortunately despite Multiple Attempts we Were Unable to Contact the Son and at about patient made 2152, patient went into a PEA cardiac arrest.  CPR was initiated.  Between 2152 and 23:10, patient loss of pulse 4 times and was resuscitated.  She received a total of 6 doses of epinephrine, 6 doses of bicarb and 2 g of calcium. We reached out to [facility] and we were able to obtain a phone number for a family friend whom we called and requested that they should go and notified the patient's son of the patient's change in condition.   At about 12:30 AM Feb 21, 2018, patient's son called and said he was on his way in.  He arrived at the bedside and decided to make the patient full comfort care with no further aggressive interventions at 23:55.  Patient was  terminally extubated and expired at 12:23 AM.  Candelaria Stagers was paged and support was provided to the family.  Pertinent Labs and Studies  Significant Diagnostic Studies Dg  Abd 1 View  Result Date: 10/21/2017 CLINICAL DATA:  Orogastric tube placement. EXAM: ABDOMEN - 1 VIEW COMPARISON:  None. FINDINGS: Tube enters the stomach and is present within the mid body. Large amount of fecal matter is noted within the colon. No sign of bowel obstruction. IMPRESSION: Soft feeding tube tip in the mid stomach. Electronically Signed   By: Paulina Fusi M.D.   On: 10/21/2017 15:07   Ct Head Wo Contrast  Result Date: 10/21/2017 CLINICAL DATA:  Acute onset of altered level of consciousness. Patient unresponsive. EXAM: CT HEAD WITHOUT CONTRAST TECHNIQUE: Contiguous axial images were obtained from the base of the skull through the vertex without intravenous contrast. COMPARISON:  CT of the head performed 02/02/2007, and MRI of the brain performed 02/05/2007 FINDINGS: Brain: No evidence of acute infarction, hemorrhage, hydrocephalus, extra-axial collection or mass lesion / mass effect. Prominence of the ventricles and sulci reflects mild cortical volume loss. Scattered periventricular and subcortical white matter change likely reflects small vessel ischemic microangiopathy. The brainstem and fourth ventricle are within normal limits. The basal ganglia are unremarkable in appearance. The cerebral hemispheres demonstrate grossly normal gray-white differentiation. No mass effect or midline shift is seen. Vascular: No hyperdense vessel or unexpected calcification. Skull: There is no evidence of fracture; visualized osseous structures are unremarkable in appearance. Sinuses/Orbits: The orbits are within normal limits. Mucosal thickening is noted at the right maxillary sinus. Paranasal sinuses and mastoid air cells are well-aerated. Other: No significant soft tissue abnormalities are seen. IMPRESSION: 1. No acute intracranial pathology seen on CT. 2. Mild cortical volume loss and scattered small vessel ischemic microangiopathy. 3. Mucosal thickening at the right maxillary sinus. Electronically Signed    By: Roanna Raider M.D.   On: 10/21/2017 04:20   Portable Chest Xray  Result Date: 10/21/2017 CLINICAL DATA:  Respiratory failure. EXAM: PORTABLE CHEST 1 VIEW COMPARISON:  October 29, 2017 and CT chest 01/30/2007. FINDINGS: Endotracheal tube terminates 3.6 cm above the carina. Nasogastric tube may be coiled in the hypopharynx with the side port in the lower esophagus. Left subclavian central line tip projects over the SVC. Defibrillator pads project over the left lower chest. Heart is enlarged, stable. Thoracic aorta is calcified. Mild interstitial prominence and indistinctness, slightly improved from yesterday. Mild lucent appearance of the upper lung zones is indicative of emphysema, as on 01/30/2007. There may be left lower lobe consolidation and a left pleural effusion. IMPRESSION: 1. Possible left lower lobe consolidation and left pleural effusion. 2. Favor improving edema. 3.  Aortic atherosclerosis (ICD10-170.0). Electronically Signed   By: Leanna Battles M.D.   On: 10/21/2017 07:46   Dg Chest Port 1 View  Result Date: 10/29/2017 CLINICAL DATA:  Central line placement. EXAM: PORTABLE CHEST 1 VIEW COMPARISON:  10-29-17 FINDINGS: The ETT is in good position. A new left central line terminates in the central SVC. No pneumothorax. The distal tip of the NG tube appears to be near the GE junction. This is stable. Cardiomegaly persists. The hila and mediastinum are unchanged. Mild opacity in the medial right lung base could represent vascular crowding and/or atelectasis. No suspicious infiltrate to suggest pneumonia. IMPRESSION: 1. New left central line terminates in the central SVC with no pneumothorax. The ETT is in good position. 2. The distal tip of the NG tube is near the GE junction. The  patient may benefit from advancement. 3. No other acute abnormalities. Electronically Signed   By: Gerome Sam III M.D   On: November 13, 2017 19:43   Dg Chest Portable 1 View  Result Date: 11/13/17 CLINICAL DATA:   Status post intubation EXAM: PORTABLE CHEST 1 VIEW COMPARISON:  11/18/2009 FINDINGS: Cardiac shadow is mildly enlarged but stable. Aortic calcifications are again seen. Endotracheal tube is noted in satisfactory position. The nasogastric catheter extends into the distal esophagus but does not reach the stomach. This should be advanced and reimaged. The lungs are well aerated bilaterally. Minimal left basilar atelectatic changes are seen. No acute bony abnormality is noted. IMPRESSION: Tubes and lines as described above. The endotracheal tube should be advanced and reimaged. Electronically Signed   By: Alcide Clever M.D.   On: 2017-11-13 19:00    Microbiology Recent Results (from the past 240 hour(s))  Blood culture (routine x 2)     Status: None (Preliminary result)   Collection Time: 11-13-2017  8:06 PM  Result Value Ref Range Status   Specimen Description BLOOD RIGHT ANTECUBITAL  Final   Special Requests   Final    BOTTLES DRAWN AEROBIC AND ANAEROBIC Blood Culture adequate volume   Culture   Final    NO GROWTH 2 DAYS Performed at [facility], [address], [place], [place] [address]    Report Status PENDING  Incomplete  Blood culture (routine x 2)     Status: None (Preliminary result)   Collection Time: Nov 13, 2017  8:06 PM  Result Value Ref Range Status   Specimen Description BLOOD LEFT WRIST  Final   Special Requests   Final    BOTTLES DRAWN AEROBIC AND ANAEROBIC Blood Culture adequate volume   Culture   Final    NO GROWTH 2 DAYS Performed at [facility], [address] [place], [place] [address]    Report Status PENDING  Incomplete  MRSA PCR Screening     Status: None   Collection Time: 11-13-17 11:54 PM  Result Value Ref Range Status   MRSA by PCR NEGATIVE NEGATIVE Final    Comment:        The GeneXpert MRSA Assay (FDA approved for NASAL specimens only), is one component of a comprehensive MRSA colonization surveillance program. It is not intended to  diagnose MRSA infection nor to guide or monitor treatment for MRSA infections. Performed at [facility], [address] [place], [place] [address]     Lab Basic Metabolic Panel: Recent Labs  Lab 11-13-17 1828 11/13/17 2359 10/21/17 0530 10/21/17 0811 10/21/17 2120  NA 143  --  144  --  133*  K 5.3*  --  3.3*  --  5.3*  CL 110  --  116*  --  110  CO2 21*  --  20*  --  13*  GLUCOSE 179*  --  155*  --  238*  BUN 57*  --  52*  --  49*  CREATININE 1.68*  --  1.40*  --  1.38*  CALCIUM 7.4*  --  6.7*  --  6.3*  MG  --  1.9  --  1.5* 2.6*  PHOS  --  4.1  --  2.9 3.9   Liver Function Tests: Recent Labs  Lab November 13, 2017 1828  AST 26  ALT 11*  ALKPHOS 32*  BILITOT 0.8  PROT 4.9*  ALBUMIN 2.1*   No results for input(s): LIPASE, AMYLASE in the last 168 hours. Recent Labs  Lab 2017-11-13 2358  AMMONIA 36*   CBC: Recent Labs  Lab 11/02/2017 1828 10/26/2017 2359 10/21/17 0530 10/21/17 1733 10/21/17 2210 10/21/17 2243  WBC 3.8 13.1* 21.7*  --  15.9*  --   NEUTROABS 2.6  --   --   --   --   --   HGB 2.8* 7.6* 6.7* 8.8* 7.9* 7.2*  HCT 9.6* 23.3* 20.7*  --  24.4*  --   MCV 84.9 84.4 83.2  --  86.3  --   PLT 170 167 139*  --  79*  --    Cardiac Enzymes: Recent Labs  Lab 11/04/2017 1828 10/17/2017 2006 10/12/2017 2359 10/21/17 0811  CKTOTAL  --   --  42  --   TROPONINI <0.03 0.03* 0.13* 0.48*   Sepsis Labs: Recent Labs  Lab 10/15/2017 1828 11/09/2017 2014 11/03/2017 2359 10/21/17 0530 10/21/17 2120 10/21/17 2210  PROCALCITON  --   --  0.55 3.57  --   --   WBC 3.8  --  13.1* 21.7*  --  15.9*  LATICACIDVEN 9.3* 6.7*  --   --  3.7*  --     Procedures/Operations  ETT CVL   Keyvon Herter S. [facility] ANP-BC Pulmonary and Critical Care Medicine [facility] Pager [phone] or [phone]  NB: This document was prepared using Dragon voice recognition software and may include unintentional dictation errors.   10/23/2017, 4:52 AM

## 2017-11-11 NOTE — Progress Notes (Signed)
Pt EXTUBATED PER ,M.TUKOV ,  .

## 2017-11-11 DEATH — deceased
